# Patient Record
Sex: Female | Born: 1960
Health system: Southern US, Community
[De-identification: ages and names within clinical notes are randomized; demographics above are authoritative.]

## PROBLEM LIST (undated history)

## (undated) DIAGNOSIS — D509 Iron deficiency anemia, unspecified: Secondary | ICD-10-CM

## (undated) DIAGNOSIS — K5190 Ulcerative colitis, unspecified, without complications: Secondary | ICD-10-CM

## (undated) DIAGNOSIS — Z8639 Personal history of other endocrine, nutritional and metabolic disease: Secondary | ICD-10-CM

## (undated) DIAGNOSIS — R011 Cardiac murmur, unspecified: Secondary | ICD-10-CM

## (undated) DIAGNOSIS — M545 Low back pain, unspecified: Secondary | ICD-10-CM

## (undated) DIAGNOSIS — M1990 Unspecified osteoarthritis, unspecified site: Secondary | ICD-10-CM

## (undated) DIAGNOSIS — I10 Essential (primary) hypertension: Secondary | ICD-10-CM

## (undated) DIAGNOSIS — Z973 Presence of spectacles and contact lenses: Secondary | ICD-10-CM

## (undated) DIAGNOSIS — N921 Excessive and frequent menstruation with irregular cycle: Secondary | ICD-10-CM

## (undated) DIAGNOSIS — R000 Tachycardia, unspecified: Secondary | ICD-10-CM

## (undated) DIAGNOSIS — R0789 Other chest pain: Secondary | ICD-10-CM

## (undated) DIAGNOSIS — E785 Hyperlipidemia, unspecified: Secondary | ICD-10-CM

## (undated) HISTORY — DX: Tachycardia, unspecified: R00.0

## (undated) HISTORY — DX: Essential (primary) hypertension: I10

## (undated) HISTORY — DX: Unspecified osteoarthritis, unspecified site: M19.90

## (undated) HISTORY — PX: ECTOPIC PREGNANCY SURGERY: SHX613

## (undated) HISTORY — DX: Cardiac murmur, unspecified: R01.1

## (undated) HISTORY — DX: Other chest pain: R07.89

## (undated) HISTORY — DX: Low back pain, unspecified: M54.50

## (undated) HISTORY — PX: INTRAUTERINE DEVICE (IUD) INSERTION: SHX5877

## (undated) HISTORY — PX: OTHER SURGICAL HISTORY: SHX169

---

## 2003-03-30 ENCOUNTER — Emergency Department (HOSPITAL_COMMUNITY): Admission: EM | Admit: 2003-03-30 | Discharge: 2003-03-30 | Payer: Self-pay | Admitting: Emergency Medicine

## 2003-03-31 ENCOUNTER — Encounter (INDEPENDENT_AMBULATORY_CARE_PROVIDER_SITE_OTHER): Payer: Self-pay | Admitting: *Deleted

## 2003-03-31 ENCOUNTER — Encounter: Payer: Self-pay | Admitting: Obstetrics

## 2003-03-31 ENCOUNTER — Inpatient Hospital Stay (HOSPITAL_COMMUNITY): Admission: AD | Admit: 2003-03-31 | Discharge: 2003-04-03 | Payer: Self-pay | Admitting: *Deleted

## 2003-03-31 ENCOUNTER — Encounter: Payer: Self-pay | Admitting: Emergency Medicine

## 2003-09-24 ENCOUNTER — Encounter: Payer: Self-pay | Admitting: Obstetrics

## 2003-09-24 ENCOUNTER — Ambulatory Visit (HOSPITAL_COMMUNITY): Admission: RE | Admit: 2003-09-24 | Discharge: 2003-09-24 | Payer: Self-pay | Admitting: Obstetrics

## 2005-03-05 ENCOUNTER — Emergency Department (HOSPITAL_COMMUNITY): Admission: EM | Admit: 2005-03-05 | Discharge: 2005-03-05 | Payer: Self-pay | Admitting: Emergency Medicine

## 2005-12-04 ENCOUNTER — Ambulatory Visit (HOSPITAL_COMMUNITY): Admission: RE | Admit: 2005-12-04 | Discharge: 2005-12-04 | Payer: Self-pay | Admitting: Obstetrics

## 2006-06-12 ENCOUNTER — Inpatient Hospital Stay (HOSPITAL_COMMUNITY): Admission: AD | Admit: 2006-06-12 | Discharge: 2006-06-12 | Payer: Self-pay | Admitting: Obstetrics & Gynecology

## 2006-07-17 ENCOUNTER — Other Ambulatory Visit: Admission: RE | Admit: 2006-07-17 | Discharge: 2006-07-17 | Payer: Self-pay | Admitting: Family Medicine

## 2007-05-22 ENCOUNTER — Other Ambulatory Visit: Admission: RE | Admit: 2007-05-22 | Discharge: 2007-05-22 | Payer: Self-pay | Admitting: Gynecology

## 2010-12-24 ENCOUNTER — Encounter: Payer: Self-pay | Admitting: Internal Medicine

## 2011-04-20 NOTE — Discharge Summary (Signed)
Amy Coffey, Amy Coffey                   ACCOUNT NO.:  [id number]   MEDICAL RECORD NO.:  [id number]                   PATIENT TYPE:  INP   LOCATION:  9324                                 FACILITY:  [facility]   PHYSICIAN:  Roseanna Rainbow, M.D.         DATE OF BIRTH:  Apr 03, 1961   DATE OF ADMISSION:  03/31/2003  DATE OF DISCHARGE:  04/03/2003                                 DISCHARGE SUMMARY   CHIEF COMPLAINT:  The patient is a 51 year old gravida 4, para 1, with a  history of left lower quadrant pain for one week worsening the day prior to  presentation.   HISTORY OF PRESENT ILLNESS:  As above.  She presented to the emergency room  at [facility] and had a positive urine pregnancy test.  An ultrasound  demonstrated mass in the cul-de-sac, no fetal pole or yolk sac, and no  evidence of an intrauterine pregnancy.  Quantitative beta hCG was 13,046.  The patient has a history of three previous ectopic pregnancies and a right  salpingectomy.   MEDICATIONS:  None.   ALLERGIES:  No known drug allergies.   PAST OBSTETRICAL/GYNECOLOGICAL HISTORY:  As above.  She has had one cesarean  delivery and a laparotomy for a right partial salpingectomy.   PAST MEDICAL HISTORY:  Remarkable for Crohn's disease, dormant at this  point.   SOCIAL HISTORY:  Married.  She denies any tobacco, ethanol, or a substance  abuse.   PHYSICAL EXAMINATION:  VITAL SIGNS:  Temperature 98.2, pulse 113,  respiratory rate 20, blood pressure 166/89.  GENERAL APPEARANCE:  Well developed, well nourished, in no acute distress.  HEENT:  Normocephalic, atraumatic.  LUNGS:  Clear.  ABDOMEN:  Soft, mild left lower quadrant tenderness.  PELVIC:  The external female genitalia are normal appearing.  The vagina was  clean.  There was mild tenderness noted in the left adnexa.  No palpable  mass.  Uterus anteverted, normal size, and nontender.   OTHER LABORATORY DATA:  White count 25,000, hemoglobin 11.  Complete  metabolic profile within normal limits.   ASSESSMENT:  Left-sided ectopic pregnancy.   PLAN:  Methotrexate and admission for a 23-hour observation.   HOSPITAL COURSE:  The patient was admitted and was given a course of  methotrexate.  Approximately 12 hours into the hospital admission the  patient complained of acute worsening of her left lower quadrant pain.  A  pelvic ultrasound at this point was consistent with a hemoperitoneum and the  patient was brought to the operating room for likely ruptured ectopic  pregnancy.  Please see the dictated operative summary for details.  Her  postoperative course was remarkable for anemia with a hemoglobin of 8.4 on  postoperative day #1.  She remained afebrile and was tolerating a regular  diet and was discharged to home on postoperative day #3.   DISCHARGE DIAGNOSIS:  Ruptured left tubal ectopic pregnancy.   PROCEDURE:  Exploratory laparotomy with left salpingectomy.   CONDITION  ON DISCHARGE:  Stable.   DIET:  Regular.   ACTIVITY:  No strenuous activity.   MEDICATIONS:  1. Percocet 1-2 tabs every 4-6 hours as needed.  2. Niferex Forte one tab p.o. b.i.d.  3. The patient was also counseled to take her own ibuprofen as needed.   DISPOSITION:  The patient was to return to the office in one week for an  incision check.                                               Roseanna Rainbow, M.D.    Judee Clara  D:  04/03/2003  T:  04/03/2003  Job:  [id number]

## 2011-04-20 NOTE — Op Note (Signed)
Amy Coffey, LIBERTO                   ACCOUNT NO.:  [id number]   MEDICAL RECORD NO.:  [id number]                   PATIENT TYPE:  INP   LOCATION:  9324                                 [facility]ACILITY:  [facility]   PHYSICIAN:  Roseanna Rainbow, M.D.         DATE OF BIRTH:  Aug 26, 1961   DATE OF PROCEDURE:  03/31/2003  DATE OF DISCHARGE:                                 OPERATIVE REPORT   PREOPERATIVE DIAGNOSIS:  Ruptured ectopic tubal pregnancy.   POSTOPERATIVE DIAGNOSIS:  Ruptured ectopic tubal pregnancy.   PROCEDURE:  Exploratory laparotomy with left salpingectomy.   SURGEON:  Roseanna Rainbow, M.D.   ASSISTANT:  Bing Neighbors. Clearance Coots, M.D.   ANESTHESIA:  General endotracheal.   COMPLICATIONS:  None.   ESTIMATED BLOOD LOSS:  Less than 50 cc for the procedure; approximately 500  cc of hemoperitoneum, and blood clots approximately 500 cc seen throughout  the hemoperitoneum.   IV FLUIDS:  3 L lactated Ringer's.   URINE OUTPUT:  250 cc.   FINDINGS:  There was a fairly significant hemoperitoneum with blood clots  filling the posterior cul-de-sac.  The ampullary portion of the left  fallopian tube was dilated, and there was active bleeding noted at the  rupture site.  The ovaries and uterus appeared normal.  There was mild  inflammatory response involving the serosa of the uterus and peritoneal  surfaces of the posterior cul-de-sac.  The right fallopian tube was absent.   DESCRIPTION OF PROCEDURE:  The patient was taken to the operating room,  where general anesthesia was administered without difficulty.  She was  placed in the dorsal supine position, and prepped and draped in the usual  sterile fashion.  A Foley catheter was placed.  A Pfannenstiel skin incision  was then made through the previous scar and carried down to the underlying  fascia with the Bovie.  The fascia was incised to the midline, and this  incision was extended bilaterally with curved Mayo scissors.   The superior  aspect of the fascial incision was then grasped with Kocher clamps, tented  up and the underlying rectus muscles dissected off with the Bovie.  This was  repeated with the inferior fascial margin.  The rectus muscles were  separated in the midline.  The parietoperitoneum was entered bluntly.  The  peritoneal incision was then extended superiorly and inferiorly, with good  visualization of the bladder.  At this point an  O'Connor-Sullivan retractor was placed into the incision.  The bowel was  packed away with moistened laparotomy sponges.  A bladder blade was placed.  The blood clots were evacuated.  The above findings were noted.  The left  fallopian tube was grasped with Babcock clamp.  The mesosalpinx was clamped  with a Kelly clamp, and the tube was excised.  The pedicle was then secured  with both the free ligature and suture ligature of 2-0 Vicryl.  Adequate  hemostasis was noted.  The pelvis was copiously  irrigated with warm normal  saline.  All the laparotomy instruments were removed from the abdomen, as  well as the retractor.  The parietoperitoneum was closed in a running  fashion using 2-0 Vicryl.  The fascia was reapproximated in the running  fashion with 0 PDS.  Individual bleeding points in the subcutaneous layer  were cauterized with the Bovie.  The subcutaneous layer was irrigated. The  skin was reapproximated with staples.  At the close of the procedure, the  instrument and pack counts were said to be correct x2.  The patient was  taken to the PACU awake, and in stable condition.                                               Roseanna Rainbow, M.D.    Judee Clara  D:  03/31/2003  T:  04/01/2003  Job:  [id number]

## 2016-01-03 ENCOUNTER — Other Ambulatory Visit: Payer: Self-pay

## 2016-01-03 DIAGNOSIS — Z1231 Encounter for screening mammogram for malignant neoplasm of breast: Secondary | ICD-10-CM

## 2016-01-12 ENCOUNTER — Other Ambulatory Visit (HOSPITAL_COMMUNITY)
Admission: RE | Admit: 2016-01-12 | Discharge: 2016-01-12 | Disposition: A | Discharge status: Home / Self Care | Payer: 59 | Source: Ambulatory Visit | Attending: Family Medicine | Admitting: Family Medicine

## 2016-01-12 ENCOUNTER — Other Ambulatory Visit: Payer: Self-pay

## 2016-01-12 DIAGNOSIS — Z01419 Encounter for gynecological examination (general) (routine) without abnormal findings: Secondary | ICD-10-CM | POA: Insufficient documentation

## 2016-01-16 LAB — CYTOLOGY - PAP

## 2016-01-18 ENCOUNTER — Ambulatory Visit
Admission: RE | Admit: 2016-01-18 | Discharge: 2016-01-18 | Disposition: A | Discharge status: Home / Self Care | Payer: 59 | Source: Ambulatory Visit

## 2016-01-18 DIAGNOSIS — Z1231 Encounter for screening mammogram for malignant neoplasm of breast: Secondary | ICD-10-CM

## 2016-03-01 ENCOUNTER — Ambulatory Visit: Payer: Self-pay | Admitting: Family Medicine

## 2016-11-15 ENCOUNTER — Other Ambulatory Visit: Payer: Self-pay | Admitting: Endocrinology

## 2016-11-15 DIAGNOSIS — E221 Hyperprolactinemia: Secondary | ICD-10-CM

## 2016-11-27 ENCOUNTER — Ambulatory Visit
Admission: RE | Admit: 2016-11-27 | Discharge: 2016-11-27 | Disposition: A | Discharge status: Home / Self Care | Payer: 59 | Source: Ambulatory Visit | Attending: Endocrinology | Admitting: Endocrinology

## 2016-11-27 DIAGNOSIS — E221 Hyperprolactinemia: Secondary | ICD-10-CM

## 2016-11-27 MED ORDER — GADOBENATE DIMEGLUMINE 529 MG/ML IV SOLN
15.0000 mL | Freq: Once | INTRAVENOUS | Status: AC | PRN
  Administered 2016-11-27: 15 mL via INTRAVENOUS

## 2016-12-13 ENCOUNTER — Ambulatory Visit: Payer: 59 | Admitting: Family Medicine

## 2017-01-07 ENCOUNTER — Ambulatory Visit (INDEPENDENT_AMBULATORY_CARE_PROVIDER_SITE_OTHER): Payer: 59 | Admitting: Neurology

## 2017-01-07 ENCOUNTER — Encounter: Payer: Self-pay | Admitting: Neurology

## 2017-01-07 VITALS — BP 139/82 | HR 89 | Ht 67.5 in | Wt 243.5 lb

## 2017-01-07 DIAGNOSIS — R9089 Other abnormal findings on diagnostic imaging of central nervous system: Secondary | ICD-10-CM | POA: Diagnosis not present

## 2017-01-07 NOTE — Progress Notes (Signed)
Reason for visit: Abnormal MRI brain  Referring physician: Dr. Parke Simmers Amy Coffey is a 57 y.o. female  History of present illness:  Amy Coffey is a 56 year old right-handed black female with a history of hyperthyroidism. The patient has been noted to have a mild elevation in prolactin level, and she was therefore sent for pituitary MRI. On the MRI, they noted an empty sella, and some dilation of the optic nerve sheaths. The patient is therefore sent to this office for an evaluation. The patient has no history of headache whatsoever, she denies any neck stiffness or muffled hearing or pulsatile tinnitus. The patient denies any numbness or weakness of the face, arms, or legs. She has not had any balance issues or difficulty controlling the bowels or the bladder. She denies any dizziness, she denies any speech changes or difficulty with swallowing. She does wear glasses, she gets an annual ophthalmologic evaluation. She is planning on getting an eye examination in the near future. She is sent to this office for further evaluation.  Past Medical History:  Diagnosis Date  . High cholesterol   . Hypertension     Past Surgical History:  Procedure Laterality Date  . ECTOPIC PREGNANCY SURGERY  2006    Family History  Problem Relation Age of Onset  . Angina Mother   . Thyroid disease Mother   . Prostate cancer Father     Social history:  reports that she quit smoking about 19 years ago. She has never used smokeless tobacco. She reports that she does not drink alcohol or use drugs.  Medications:  Prior to Admission medications   Medication Sig Start Date End Date Taking? Authorizing Provider  APRISO 0.375 g 24 hr capsule Take 4 capsules by mouth daily. 11/08/16  Yes Historical Provider, MD  CAMILA 0.35 MG tablet Take 1 tablet by mouth daily. 11/14/16  Yes Historical Provider, MD  Cholecalciferol (VITAMIN D3) 1000 units CAPS Take 1 capsule by mouth daily.   Yes Historical Provider, MD   FeFum-FePoly-FA-B Cmp-C-Biot (FOLIVANE-PLUS) CAPS Take 1 capsule by mouth daily. 11/30/16  Yes Historical Provider, MD  Flaxseed, Linseed, (FLAXSEED OIL PO) Take 1 Dose by mouth daily.   Yes Historical Provider, MD  fluocinonide (LIDEX) 0.05 % external solution Apply 1 application topically as needed. 10/19/16  Yes Historical Provider, MD  losartan-hydrochlorothiazide (HYZAAR) 100-25 MG tablet Take 1 tablet by mouth daily. 12/01/16  Yes Historical Provider, MD  Omega-3 Fatty Acids (FISH OIL) 1000 MG CAPS Take 1 capsule by mouth daily.   Yes Historical Provider, MD  VOLTAREN 1 % GEL Apply 1 application topically as needed. 11/15/16  Yes Historical Provider, MD     Allergies not on file  ROS:  Out of a complete 14 system review of symptoms, the patient complains only of the following symptoms, and all other reviewed systems are negative.  Heart murmur Snoring Anemia Feeling hot, cold Joint pain  Blood pressure 139/82, pulse 89, height 5' 7.5" (1.715 m), weight 243 lb 8 oz (110.5 kg).  Physical Exam  General: The patient is alert and cooperative at the time of the examination. The patient is markedly obese.  Eyes: Pupils are equal, round, and reactive to light. Discs are flat bilaterally.  Neck: The neck is supple, no carotid bruits are noted.  Respiratory: The respiratory examination is clear.  Cardiovascular: The cardiovascular examination reveals a regular rate and rhythm, no obvious murmurs or rubs are noted.  Skin: Extremities are without significant edema.  Neurologic Exam  Mental status: The patient is alert and oriented x 3 at the time of the examination. The patient has apparent normal recent and remote memory, with an apparently normal attention span and concentration ability.  Cranial nerves: Facial symmetry is present. There is good sensation of the face to pinprick and soft touch bilaterally. The strength of the facial muscles and the muscles to head turning and  shoulder shrug are normal bilaterally. Speech is well enunciated, no aphasia or dysarthria is noted. Extraocular movements are full. Visual fields are full. The tongue is midline, and the patient has symmetric elevation of the soft palate. No obvious hearing deficits are noted.  Motor: The motor testing reveals 5 over 5 strength of all 4 extremities. Good symmetric motor tone is noted throughout.  Sensory: Sensory testing is intact to pinprick, soft touch, vibration sensation, and position sense on all 4 extremities. No evidence of extinction is noted.  Coordination: Cerebellar testing reveals good finger-nose-finger and heel-to-shin bilaterally.  Gait and station: Gait is normal. Tandem gait is normal. Romberg is negative. No drift is seen.  Reflexes: Deep tendon reflexes are symmetric and normal bilaterally. Toes are downgoing bilaterally.   MRI brain 11/27/16:  IMPRESSION: Negative for pituitary microadenoma. Enlarged sella compatible with empty sella. Small pituitary enhances homogeneously.  Small white matter hyperintensities bilaterally may be due to migraine headache or chronic microvascular ischemia.  Distended optic nerve sheaths bilaterally can be seen with elevated intracranial pressure.  * MRI scan images were reviewed online. I agree with the written report.    Assessment/Plan:  1. Abnormal MRI brain  The patient has very minimal white matter changes, white matter changes are very nonspecific. The patient has an empty sella and some dilation of the optic nerve sheaths. These changes could be associated with pseudotumor cerebri, but the patient is offering no history that would suggest this clinical entity. The patient will be going for ophthalmologic evaluation in the near future. If mild papilledema is noted, I will set the patient up for a lumbar puncture. If the eye examination is unremarkable, I would not pursue any further workup. The patient will follow-up  through this office if needed.  Amy Alexanders MD 01/07/2017 3:06 PM  [facility] [address] [place], [place] [address]  Phone [phone] Fax [phone]

## 2017-01-10 ENCOUNTER — Ambulatory Visit: Payer: 59 | Admitting: Family Medicine

## 2017-01-17 ENCOUNTER — Encounter: Payer: Self-pay | Admitting: Family Medicine

## 2017-01-17 ENCOUNTER — Ambulatory Visit (INDEPENDENT_AMBULATORY_CARE_PROVIDER_SITE_OTHER): Payer: 59 | Admitting: Family Medicine

## 2017-01-17 VITALS — BP 126/80 | HR 75 | Resp 12 | Ht 67.5 in | Wt 243.4 lb

## 2017-01-17 DIAGNOSIS — E785 Hyperlipidemia, unspecified: Secondary | ICD-10-CM

## 2017-01-17 DIAGNOSIS — N938 Other specified abnormal uterine and vaginal bleeding: Secondary | ICD-10-CM

## 2017-01-17 DIAGNOSIS — D509 Iron deficiency anemia, unspecified: Secondary | ICD-10-CM | POA: Diagnosis not present

## 2017-01-17 DIAGNOSIS — Z6837 Body mass index (BMI) 37.0-37.9, adult: Secondary | ICD-10-CM

## 2017-01-17 DIAGNOSIS — E669 Obesity, unspecified: Secondary | ICD-10-CM | POA: Insufficient documentation

## 2017-01-17 DIAGNOSIS — I10 Essential (primary) hypertension: Secondary | ICD-10-CM

## 2017-01-17 MED ORDER — LOSARTAN POTASSIUM-HCTZ 100-25 MG PO TABS: 1.0000 | ORAL_TABLET | Freq: Every day | ORAL | 2 refills | Status: DC

## 2017-01-17 NOTE — Progress Notes (Signed)
Pre visit review using our clinic review tool, if applicable. No additional management support is needed unless otherwise documented below in the visit note. 

## 2017-01-17 NOTE — Patient Instructions (Addendum)
A few things to remember from today's visit:   Iron deficiency anemia, unspecified iron deficiency anemia type - Plan: CBC, Ferritin  Hyperlipidemia, unspecified hyperlipidemia type - Plan: Lipid panel, Basic metabolic panel  BMI [id number], adult  Hypertension, essential - Plan: Basic metabolic panel, losartan-hydrochlorothiazide (HYZAAR) 100-25 MG tablet  What are some tips for weight loss? People become overweight for many reasons. Weight issues can run in families. They can be caused by unhealthy behaviors and a person's environment. Certain health problems and medicines can also lead to weight gain. There are some simple things you can do to reach and maintain a healthy weight:  Eat small more frequent healthy meals instead 3 bid meals. Also Weight Watchers is a good option. Avoid sweet drinks. These include regular soft drinks, fruit juices, fruit drinks, energy drinks, sweetened iced tea, and flavored milk. Avoid fast foods. Fast foods such as french fries, hamburgers, chicken nuggets, and pizza are high in calories and can cause weight gain. Eat a healthy breakfast. People who skip breakfast tend to weigh more. Don't watch more than two hours of television per day. Chew sugar-free gum between meals to cut down on snacking. Avoid grocery shopping when you're hungry. Pack a healthy lunch instead of eating out to control what and how much you eat. Eat a lot of fruits and vegetables. Aim for about 2 cups of fruit and 2 to 3 cups of vegetables per day. Aim for 150 minutes per week of moderate-intensity exercise (such as brisk walking), or 75 minutes per week of vigorous exercise (such as jogging or running). OR 15-30 min of daily brisk walking. Be more active. Small changes in physical activity can easily be added to your daily routine. For example, take the stairs instead of the elevator. Take a walk with your family. A daily walk is a great way to get exercise and to catch up on the  day's events.   Arrange follow up with your gyn. Please be sure medication list is accurate. If a new problem present, please set up appointment sooner than planned today.

## 2017-01-17 NOTE — Progress Notes (Signed)
HPI:   [name] is a 56 y.o. female, who is here today to establish care with me.  Former PCP: Dr Amy Coffey at [facility], [place] Last preventive routine visit: a year ago.  Chronic medical problems: HTN,HLD,hyperprolactinemia.   Hypertension: Dx at age 50. Currently she is on Losartan-HCTZ 50-12.5 mg daily and Metoprolol Succinate 50 mg daily. Reporting no side effects from medication. He is not checking BP at home.  He denies any frequent/severe headache, visual changes, chest pain, dyspnea, palpitation, abdominal pain, nausea, vomiting, or edema.   Hyperlipidemia:  Currently on nonpharmacologic treatment. In the past she was on Lipitor, which she didn't tolerate well, made her "sick." She has tried Crestor a few years ago, better tolerated, not covered by her insurance at that time.    Concerns today:    -6 months of intermittent vaginal bleeding.  She follows with gyn and according to pt, she already had work up done and underwent endometrial Bx. Prolactin level was elevated, so she was referred to endocrinologists. Brain MRI did not show pituitary microadenomas but rather an enlarged sella compatible with empty sella. She was also evaluated by ophthalmologists, who she saw yesterday.She has also seen neurologists, 01/07/17.  Elevated THS, normal when TSH was repeated.  She is taking Camilla but still having vaginal bleeding q 10 days, mild bleeding that usually lasts 5 days.Bleeding is usually noted mainly on tissue when wiping. She denies gross hematuria or urinary symptoms.  IUD was recommended but she is not interested.  She has also followed with hematology because Hx of iron deficiency anemia. She is on Iron supplementation.   -She is also c/o hot flashes, feeling hot and cold, for about 8-9 months. According to pt, she had "hormones' checked and was told she was in "menopause"  + Mood swings. She denies suicidal thoughts or Hx of  depression/anxiety.  -She does not exercise regularly and does not follow a healthy diet consistently.  She lives with her husband.   Review of Systems  Constitutional: Negative for activity change, appetite change, fatigue, fever and unexpected weight change.  HENT: Negative for mouth sores, nosebleeds and trouble swallowing.   Eyes: Negative for redness and visual disturbance.  Respiratory: Negative for cough, shortness of breath and wheezing.   Cardiovascular: Negative for chest pain, palpitations and leg swelling.  Gastrointestinal: Negative for abdominal pain, blood in stool, nausea and vomiting.       Negative for changes in bowel habits.  Endocrine: Negative for polydipsia, polyphagia and polyuria.  Genitourinary: Positive for menstrual problem and vaginal bleeding. Negative for decreased urine volume, difficulty urinating, dyspareunia, dysuria, hematuria, pelvic pain and vaginal discharge.  Musculoskeletal: Positive for arthralgias (Hx of OA.). Negative for gait problem and myalgias.  Skin: Negative for rash.  Neurological: Negative for syncope, weakness and headaches.  Hematological: Negative for adenopathy. Does not bruise/bleed easily.  Psychiatric/Behavioral: Negative for confusion and suicidal ideas. The patient is nervous/anxious.       Current Outpatient Prescriptions on File Prior to Visit  Medication Sig Dispense Refill  . APRISO 0.375 g 24 hr capsule Take 4 capsules by mouth daily.  3  . CAMILA 0.35 MG tablet Take 1 tablet by mouth daily.  4  . Cholecalciferol (VITAMIN D3) 1000 units CAPS Take 1 capsule by mouth daily.    Marland Kitchen FeFum-FePoly-FA-B Cmp-C-Biot (FOLIVANE-PLUS) CAPS Take 1 capsule by mouth daily.  1  . Flaxseed, Linseed, (FLAXSEED OIL PO) Take 1 Dose by mouth daily.    Marland Kitchen  fluocinonide (LIDEX) 0.05 % external solution Apply 1 application topically as needed.  2  . Omega-3 Fatty Acids (FISH OIL) 1000 MG CAPS Take 1 capsule by mouth daily.    . VOLTAREN 1 % GEL  Apply 1 application topically as needed.  2   No current facility-administered medications on file prior to visit.      Past Medical History:  Diagnosis Date  . Arthritis   . Blood in stool   . Heart murmur   . High cholesterol   . Hypertension    Not on File  Family History  Problem Relation Age of Onset  . Angina Mother   . Thyroid disease Mother   . Arthritis Mother   . Heart disease Mother   . Hypertension Mother   . Hyperlipidemia Mother   . Prostate cancer Father   . Arthritis Father   . Cancer Father     prostate  . Hypertension Father   . Cancer Maternal Aunt     breast    Social History   Social History  . Marital status: Married    Spouse name: Amy Coffey  . Number of children: 1  . Years of education: BA   Occupational History  . [employer]    Social History Main Topics  . Smoking status: Former Smoker    Quit date: 12/03/1997  . Smokeless tobacco: Never Used  . Alcohol use No  . Drug use: No  . Sexual activity: Not Asked   Other Topics Concern  . None   Social History Narrative   Lives with husband   Caffeine use: coffee daily   P[employer]i on weekends    Vitals:   01/17/17 1519  BP: 126/80  Pulse: 75  Resp: 12   O2 sat at RA 95%. Body mass index is 37.56 kg/m.   Physical Exam  Nursing note and vitals reviewed. Constitutional: She is oriented to person, place, and time. She appears well-developed. No distress.  HENT:  Head: Atraumatic.  Mouth/Throat: Oropharynx is clear and moist and mucous membranes are normal.  Eyes: Conjunctivae and EOM are normal. Pupils are equal, round, and reactive to light.  Neck: No tracheal deviation present. Thyromegaly (mild) present. No thyroid mass present.  Cardiovascular: Normal rate and regular rhythm.   Murmur (? Soft SEM RUSB) heard. Pulses:      Dorsalis pedis pulses are 2+ on the right side, and 2+ on the left side.  Respiratory: Effort normal and breath sounds normal. No respiratory distress.  GI:  Soft. She exhibits no mass. There is no hepatomegaly. There is no tenderness.  Musculoskeletal: She exhibits no edema or tenderness.  Lymphadenopathy:    She has no cervical adenopathy.  Neurological: She is alert and oriented to person, place, and time. She has normal strength. No cranial nerve deficit. Coordination and gait normal.  Skin: Skin is warm. No rash noted. No erythema.  Psychiatric: Her mood appears anxious.  Well groomed, good eye contact.      ASSESSMENT AND PLAN:   Grenda was seen today for establish care.  Diagnoses and all orders for this visit:  Iron deficiency anemia, unspecified iron deficiency anemia type  No changes in current management, will follow labs and will give further recommendations accordingly.  -     CBC; Future -     Ferritin; Future  Hyperlipidemia, unspecified hyperlipidemia type  Low fat diet discussed and recommended. Further recommendations will be given according to lab results.Will consider Crestor if statin treatment is  needed. F/U in 6-12 months.   -     Lipid panel; Future -     Basic metabolic panel; Future  BMI 37.0-37.9, adult  We discussed benefits of wt loss as well as adverse effects of obesity. Consistency with healthy diet and physical activity recommended. Daily brisk walking for 15-30 min as tolerated.  Hypertension, essential  Adequately controlled. No changes in current management. DASH diet recommended. Eye exam recommended annually. F/U in 6 months, before if needed.  -     Basic metabolic panel; Future -     losartan-hydrochlorothiazide (HYZAAR) 100-25 MG tablet; Take 1 tablet by mouth daily.  Dysfunctional uterine bleeding  According to pt, she has already followed with gyn,labs and Bx were done. She was offered IUD but she is not interested in doing so. Recommended following with gyn to discuss other treatment options. Reporting colonoscopy up to date. Instructed about warning signs.  -      CBC; Future   She is not fasting today, will come back in a few days for fasting labs.    Betty G. Martinique, MD  [facility]. [place] office.

## 2017-01-28 ENCOUNTER — Other Ambulatory Visit (INDEPENDENT_AMBULATORY_CARE_PROVIDER_SITE_OTHER): Payer: 59

## 2017-01-28 DIAGNOSIS — N938 Other specified abnormal uterine and vaginal bleeding: Secondary | ICD-10-CM | POA: Diagnosis not present

## 2017-01-28 DIAGNOSIS — E785 Hyperlipidemia, unspecified: Secondary | ICD-10-CM | POA: Diagnosis not present

## 2017-01-28 DIAGNOSIS — I10 Essential (primary) hypertension: Secondary | ICD-10-CM | POA: Diagnosis not present

## 2017-01-28 DIAGNOSIS — D509 Iron deficiency anemia, unspecified: Secondary | ICD-10-CM | POA: Diagnosis not present

## 2017-01-28 LAB — LIPID PANEL
Cholesterol: 227 mg/dL — ABNORMAL HIGH (ref 0–200)
HDL: 40.3 mg/dL
LDL Cholesterol: 163 mg/dL — ABNORMAL HIGH (ref 0–99)
NonHDL: 186.89
Total CHOL/HDL Ratio: 6
Triglycerides: 117 mg/dL (ref 0.0–149.0)
VLDL: 23.4 mg/dL (ref 0.0–40.0)

## 2017-01-28 LAB — BASIC METABOLIC PANEL WITH GFR
BUN: 9 mg/dL (ref 6–23)
CO2: 28 meq/L (ref 19–32)
Calcium: 9.2 mg/dL (ref 8.4–10.5)
Chloride: 104 meq/L (ref 96–112)
Creatinine, Ser: 0.8 mg/dL (ref 0.40–1.20)
GFR: 96.15 mL/min
Glucose, Bld: 92 mg/dL (ref 70–99)
Potassium: 3.7 meq/L (ref 3.5–5.1)
Sodium: 140 meq/L (ref 135–145)

## 2017-01-28 LAB — FERRITIN: Ferritin: 51.6 ng/mL (ref 10.0–291.0)

## 2017-01-28 LAB — CBC
HCT: 33.7 % — ABNORMAL LOW (ref 36.0–46.0)
Hemoglobin: 10.9 g/dL — ABNORMAL LOW (ref 12.0–15.0)
MCHC: 32.4 g/dL (ref 30.0–36.0)
MCV: 82.4 fl (ref 78.0–100.0)
PLATELETS: 547 10*3/uL — AB (ref 150.0–400.0)
RBC: 4.1 Mil/uL (ref 3.87–5.11)
RDW: 16.7 % — AB (ref 11.5–15.5)
WBC: 11.4 10*3/uL — ABNORMAL HIGH (ref 4.0–10.5)

## 2017-01-31 ENCOUNTER — Telehealth: Payer: Self-pay | Admitting: Family Medicine

## 2017-01-31 NOTE — Telephone Encounter (Signed)
Pt calling to check the status of her labs.

## 2017-02-04 ENCOUNTER — Other Ambulatory Visit: Payer: Self-pay

## 2017-02-04 MED ORDER — ROSUVASTATIN CALCIUM 10 MG PO TABS: 10.0000 mg | ORAL_TABLET | Freq: Every day | ORAL | 1 refills | Status: DC

## 2017-02-05 NOTE — Telephone Encounter (Signed)
Pt has been given lab results per Judson Roch

## 2017-03-06 ENCOUNTER — Other Ambulatory Visit: Payer: Self-pay | Admitting: Family Medicine

## 2017-03-06 DIAGNOSIS — Z1231 Encounter for screening mammogram for malignant neoplasm of breast: Secondary | ICD-10-CM

## 2017-03-13 ENCOUNTER — Ambulatory Visit
Admission: RE | Admit: 2017-03-13 | Discharge: 2017-03-13 | Disposition: A | Discharge status: Home / Self Care | Payer: 59 | Source: Ambulatory Visit | Attending: Family Medicine | Admitting: Family Medicine

## 2017-03-13 DIAGNOSIS — Z1231 Encounter for screening mammogram for malignant neoplasm of breast: Secondary | ICD-10-CM

## 2017-05-22 ENCOUNTER — Ambulatory Visit (INDEPENDENT_AMBULATORY_CARE_PROVIDER_SITE_OTHER): Payer: 59 | Admitting: Obstetrics & Gynecology

## 2017-05-22 ENCOUNTER — Encounter: Payer: Self-pay | Admitting: Obstetrics & Gynecology

## 2017-05-22 VITALS — BP 134/70

## 2017-05-22 DIAGNOSIS — E221 Hyperprolactinemia: Secondary | ICD-10-CM | POA: Diagnosis not present

## 2017-05-22 DIAGNOSIS — N920 Excessive and frequent menstruation with regular cycle: Secondary | ICD-10-CM

## 2017-05-22 NOTE — Patient Instructions (Signed)
1. Polymenorrhea BTB q2 wks on Camila.  Pelvic US/EBx benign at [place] less than a year ago.  Will obtain Medical record.  Medical management with Nexplanon or Progestin IUD discussed, patient declined.  Novasure Endometrial Ablation, risks/benefits/procedure reviewed.  Patient will think about it and call back to schedule if decides to proceed.  Hysterectomy not recommended.   2. Hyperprolactinemia ([place]) Seen by Endocrino.  MRI negative for Pituitary Microadenoma.  No medication.  Will repeat Prolactin today. - Prolactin  F/U Annual/Gyn visit when due.  Amy Coffey, it was a pleasure to see you today!  I will inform you of your result as soon as available.  Let me know if you want to proceed with the Novasure Endometrial Ablation.   Endometrial Ablation Endometrial ablation is a procedure that destroys the thin inner layer of the lining of the uterus (endometrium). This procedure may be done:  To stop heavy periods.  To stop bleeding that is causing anemia.  To control irregular bleeding.  To treat bleeding caused by small tumors (fibroids) in the endometrium.  This procedure is often an alternative to major surgery, such as removal of the uterus and cervix (hysterectomy). As a result of this procedure:  You may not be able to have children. However, if you are premenopausal (you have not gone through menopause): ? You may still have a small chance of getting pregnant. ? You will need to use a reliable method of birth control after the procedure to prevent pregnancy.  You may stop having a menstrual period, or you may have only a small amount of bleeding during your period. Menstruation may return several years after the procedure.  Tell a health care provider about:  Any allergies you have.  All medicines you are taking, including vitamins, herbs, eye drops, creams, and over-the-counter medicines.  Any problems you or family members have had with the use of anesthetic  medicines.  Any blood disorders you have.  Any surgeries you have had.  Any medical conditions you have. What are the risks? Generally, this is a safe procedure. However, problems may occur, including:  A hole (perforation) in the uterus or bowel.  Infection of the uterus, bladder, or vagina.  Bleeding.  Damage to other structures or organs.  An air bubble in the lung (air embolus).  Problems with pregnancy after the procedure.  Failure of the procedure.  Decreased ability to diagnose cancer in the endometrium.  What happens before the procedure?  You will have tests of your endometrium to make sure there are no pre-cancerous cells or cancer cells present.  You may have an ultrasound of the uterus.  You may be given medicines to thin the endometrium.  Ask your health care provider about: ? Changing or stopping your regular medicines. This is especially important if you take diabetes medicines or blood thinners. ? Taking medicines such as aspirin and ibuprofen. These medicines can thin your blood. Do not take these medicines before your procedure if your doctor tells you not to.  Plan to have someone take you home from the hospital or clinic. What happens during the procedure?  You will lie on an exam table with your feet and legs supported as in a pelvic exam.  To lower your risk of infection: ? Your health care team will wash or sanitize their hands and put on germ-free (sterile) gloves. ? Your genital area will be washed with soap.  An IV tube will be inserted into one of your veins.  You will be given a medicine to help you relax (sedative).  A surgical instrument with a light and camera (resectoscope) will be inserted into your vagina and moved into your uterus. This allows your surgeon to see inside your uterus.  Endometrial tissue will be removed using one of the following methods: ? Radiofrequency. This method uses a radiofrequency-alternating electric  current to remove the endometrium. ? Cryotherapy. This method uses extreme cold to freeze the endometrium. ? Heated-free liquid. This method uses a heated saltwater (saline) solution to remove the endometrium. ? Microwave. This method uses high-energy microwaves to heat up the endometrium and remove it. ? Thermal balloon. This method involves inserting a catheter with a balloon tip into the uterus. The balloon tip is filled with heated fluid to remove the endometrium. The procedure may vary among health care providers and hospitals. What happens after the procedure?  Your blood pressure, heart rate, breathing rate, and blood oxygen level will be monitored until the medicines you were given have worn off.  As tissue healing occurs, you may notice vaginal bleeding for 4-6 weeks after the procedure. You may also experience: ? Cramps. ? Thin, watery vaginal discharge that is light pink or brown in color. ? A need to urinate more frequently than usual. ? Nausea.  Do not drive for 24 hours if you were given a sedative.  Do not have sex or insert anything into your vagina until your health care provider approves. Summary  Endometrial ablation is done to treat the many causes of heavy menstrual bleeding.  The procedure may be done only after medications have been tried to control the bleeding.  Plan to have someone take you home from the hospital or clinic. This information is not intended to replace advice given to you by your health care provider. Make sure you discuss any questions you have with your health care provider. Document Released: 09/28/2004 Document Revised: 12/06/2016 Document Reviewed: 12/06/2016 Elsevier Interactive Patient Education  2017 [employer].

## 2017-05-22 NOTE — Progress Notes (Signed)
    Amy Coffey 1961-02-05 [id number]        56 y.o.  On Progestin-only pill  RP:  Persitent BTB/Polymenorrhea on Progestin-only pill  HPI:  Continues to have periods with mild to moderate flow x 5 days every 14-15 days on Camila.  No pelvic pain.  Investigated <1 yr ago by myself at [facility] with a Pelvic US and EBx, which was benign.  Prolactin was mildly increased.  Pituitary MRI 11/2016 didn't show any Microadenoma.  No treatment for Hyperprolactinemia, per patient, level had gone down.  Past medical history,surgical history, problem list, medications, allergies, family history and social history were all reviewed and documented in the EPIC chart.  Directed ROS with pertinent positives and negatives documented in the history of present illness/assessment and plan.  Exam:  Vitals:   05/22/17 1525  BP: 134/70   General appearance:  Normal  Gyn exam:  Vulva normal                     Speculum:  Mild menstrual bleeding.  Cervix normal, no polyp.  Vagina normal.                     Bimanual exam:  AV uterus, no adnexal mass, NT.  Assessment/Plan:  56 y.o.   1. Polymenorrhea BTB q2 wks on Camila.  Pelvic US/EBx benign at [place] less than a year ago.  Will obtain Medical record.  Medical management with Nexplanon or Progestin IUD discussed, patient declined.  Novasure Endometrial Ablation, risks/benefits/procedure reviewed.  Patient will think about it and call back to schedule if decides to proceed.  Hysterectomy not recommended.   2. Hyperprolactinemia ([place]) Seen by Endocrino.  MRI negative for Pituitary Microadenoma.  No medication.  Will repeat Prolactin today. - Prolactin  F/U Annual/Gyn visit when due.  Counseling on above issues >50% x 25 minutes.  Princess Bruins MD, 3:39 PM 05/22/2017

## 2017-05-23 ENCOUNTER — Telehealth: Payer: Self-pay

## 2017-05-23 LAB — PROLACTIN: PROLACTIN: 20.3 ng/mL

## 2017-05-23 NOTE — Telephone Encounter (Signed)
Patient asked if perhaps you could "increase" her birth control pills to help with bleeding. She said she does not think she is going to be able to afford ablation right now. (I am going to check ins benefits for her.)

## 2017-05-24 MED ORDER — NORETHIN ACE-ETH ESTRAD-FE 1-20 MG-MCG(24) PO TABS: 1.0000 | ORAL_TABLET | Freq: Every day | ORAL | 3 refills | Status: DC

## 2017-05-24 NOTE — Telephone Encounter (Signed)
Patient called back just now because she will start a new pack of pills on Sunday and if you thought increasing dose was  A good idea she wanted to get new Rx today.

## 2017-05-24 NOTE — Telephone Encounter (Signed)
Yes, given her normal Prolactin level, I agree with Lomedia 24 Fe 1/20 to be prescribed.

## 2017-05-24 NOTE — Telephone Encounter (Signed)
Rx sent to [facility]. Pt to be informed to start [place] on Sunday start, this Sunday. KW CMA

## 2017-05-27 ENCOUNTER — Telehealth: Payer: Self-pay

## 2017-05-27 NOTE — Telephone Encounter (Signed)
Per DPR access note on file I left detailed message in voice mail regarding patient's ins benefits for outpatient surgery/Novasure ablation.

## 2017-07-08 ENCOUNTER — Telehealth: Payer: Self-pay

## 2017-07-08 NOTE — Telephone Encounter (Addendum)
Patient called stating she has decided she wants to proceed with endometrial ablation.  If ok, please send surgery order.  (Patient wanted to schedule for end of next week while husband is off. I scheduled her for Thurs Aug 16 1:00pm (you are off on Friday.)

## 2017-07-09 NOTE — Telephone Encounter (Signed)
Will do, thank you

## 2017-07-11 ENCOUNTER — Encounter (HOSPITAL_BASED_OUTPATIENT_CLINIC_OR_DEPARTMENT_OTHER): Payer: Self-pay | Admitting: *Deleted

## 2017-07-11 NOTE — Progress Notes (Addendum)
NPO AFTER MN W/ EXCEPTION CLEAR LIQUIDS UNTIL 0700 (NO CREAM Tower PRODUCTS).   ARRIVE AT 1130.  NEEDS URINE PREG. AND EKG.   GETTING CBC AND BMET DONE AT DR LAVOIE OFFICE PRIOR TO DOS.

## 2017-07-12 ENCOUNTER — Other Ambulatory Visit: Payer: Self-pay | Admitting: Obstetrics & Gynecology

## 2017-07-12 DIAGNOSIS — N920 Excessive and frequent menstruation with regular cycle: Secondary | ICD-10-CM

## 2017-07-14 NOTE — Progress Notes (Signed)
HPI:   [name] is a 56 y.o. female, who is here today to follow on some chronic medical problems.  Last seen on 01/17/17. Since her last OV, she has seen Dr Dellis Filbert, gyn.  Hypertension:   Dx around 14 years ago. Currently on Losartan-HCTZ 50-25 mg and Metoprolol Succinate 50 mg daily.   She is taking medications as instructed, no side effects reported.  She denies headache, visual changes, exertional chest pain, dyspnea,  focal weakness, or edema.   Lab Results  Component Value Date   CREATININE 0.80 01/28/2017   BUN 9 01/28/2017   NA 140 01/28/2017   K 3.7 01/28/2017   CL 104 01/28/2017   CO2 28 01/28/2017      Hyperlipidemia:  Currently on Crestor 10 mg daily , which was started in 01/2017. Following a low fat diet:Trying to do so.  She has not noted side effects with medication.  Lab Results  Component Value Date   CHOL 227 (H) 01/28/2017   HDL 40.30 01/28/2017   LDLCALC 163 (H) 01/28/2017   TRIG 117.0 01/28/2017   CHOLHDL 6 01/28/2017   She is not exercising regularly.  She also would like her iron checked. Hx of iron deficiency anemia and DUB, she is on Fe Sulfate 325 mg daily and recently her OCP was changed by her gyn. About 2 weeks ago she felt more fatigue than usual, denies pica. Denies exertional dyspnea ,palpitations, dizziness, or syncope.  Lab Results  Component Value Date   WBC 11.4 (H) 01/28/2017   HGB 10.9 (L) 01/28/2017   HCT 33.7 (L) 01/28/2017   MCV 82.4 01/28/2017   PLT 547.0 (H) 01/28/2017   LMP a month ago.  Denies gun/nose bleeding, gross hematuria,melena,blood in stool, or bruising.  She also would like her ear check. Right ear mild hearing loss. She states that she has had trouble with cerumen impaction in the past. She denies recent URI, ear discharge, or earache. She has not used OTC cerumen removal products.    Review of Systems  Constitutional: Positive for fatigue. Negative for activity change,  appetite change, fever and unexpected weight change.  HENT: Positive for hearing loss. Negative for ear discharge, ear pain, mouth sores, nosebleeds, sore throat, trouble swallowing and voice change.   Eyes: Negative for redness and visual disturbance.  Respiratory: Negative for cough, shortness of breath and wheezing.   Cardiovascular: Negative for chest pain, palpitations and leg swelling.  Gastrointestinal: Negative for abdominal pain, nausea and vomiting.       Negative for changes in bowel habits.  Endocrine: Negative for cold intolerance and heat intolerance.  Genitourinary: Negative for decreased urine volume, dysuria and hematuria.  Musculoskeletal: Negative for gait problem and myalgias.  Skin: Negative for pallor and rash.  Allergic/Immunologic: Positive for environmental allergies.  Neurological: Negative for dizziness, syncope, weakness and headaches.  Hematological: Negative for adenopathy. Does not bruise/bleed easily.  Psychiatric/Behavioral: Negative for confusion. The patient is not nervous/anxious.      Current Outpatient Prescriptions on File Prior to Visit  Medication Sig Dispense Refill  . Cholecalciferol (VITAMIN D3) 1000 units CAPS Take 1 capsule by mouth daily.    . ferrous sulfate 325 (65 FE) MG EC tablet Take 325 mg by mouth every morning.    . mesalamine (APRISO) 0.375 g 24 hr capsule Take 1,500 mg by mouth every morning.     . Norethindrone Acetate-Ethinyl Estrad-FE (LOMEDIA 24 FE) 1-20 MG-MCG(24) tablet Take 1 tablet by mouth  daily. (Patient taking differently: Take 1 tablet by mouth every evening. ) 1 Package 3  . VOLTAREN 1 % GEL Apply 1 application topically as needed.   2   No current facility-administered medications on file prior to visit.      Past Medical History:  Diagnosis Date  . Arthritis    knees  . Heart murmur    per pt had echo done yrs ago was told mild  . History of hyperprolactinemia    02/ 2018  resolved  . Hyperlipidemia   .  Hypertension   . Iron deficiency anemia   . Menometrorrhagia    REFRACTORY TO PROGESTIN  . Ulcerative colitis ([place]) followed by [facility]   per pt dx 1990s  . Wears glasses    No Known Allergies  Social History   Social History  . Marital status: Married    Spouse name: [name]  . Number of children: 1  . Years of education: BA   Occupational History  . [employer]    Social History Main Topics  . Smoking status: Former Smoker    Years: 10.00    Types: Cigarettes    Quit date: 12/03/1997  . Smokeless tobacco: Never Used  . Alcohol use No  . Drug use: No  . Sexual activity: Not Asked   Other Topics Concern  . None   Social History Narrative   Lives with husband   Caffeine use: coffee daily   Pepsi on weekends    Vitals:   07/15/17 1434  BP: 110/80  Pulse: 82  Resp: 12  SpO2: 97%   Body mass index is 38.6 kg/m.   Wt Readings from Last 3 Encounters:  07/15/17 250 lb 2 oz (113.5 kg)  01/17/17 243 lb 6 oz (110.4 kg)  01/07/17 243 lb 8 oz (110.5 kg)     Physical Exam  Nursing note and vitals reviewed. Constitutional: She is oriented to person, place, and time. She appears well-developed. No distress.  HENT:  Head: Normocephalic and atraumatic.  Right Ear: External ear normal.  Left Ear: Hearing, tympanic membrane, external ear and ear canal normal.  Mouth/Throat: Oropharynx is clear and moist and mucous membranes are normal.  Right ear cerumen impaction, cannot visualized TM  Eyes: Pupils are equal, round, and reactive to light. Conjunctivae are normal.  Cardiovascular: Normal rate and regular rhythm.   Murmur (soft SEM RUSB) heard. Pulses:      Dorsalis pedis pulses are 2+ on the right side, and 2+ on the left side.  Respiratory: Effort normal and breath sounds normal. No respiratory distress.  GI: Soft. She exhibits no mass. There is no hepatomegaly. There is no tenderness.  Musculoskeletal: She exhibits no edema or tenderness.  Lymphadenopathy:    She  has no cervical adenopathy.  Neurological: She is alert and oriented to person, place, and time. She has normal strength. Gait normal.  Skin: Skin is warm. No rash noted. No erythema.  Psychiatric: She has a normal mood and affect.  Well groomed, good eye contact.     ASSESSMENT AND PLAN:   [name] was seen today for follow-up.  Diagnoses and all orders for this visit:  Hypertension, essential  Adequately controlled. No changes in current management. DASH-low salt diet recommended. Eye exam recommended annually. F/U in 6 months, before if needed.  -     Comprehensive metabolic panel; Future -     losartan-hydrochlorothiazide (HYZAAR) 100-25 MG tablet; Take 1 tablet by mouth every morning.  Class  2 obesity without serious comorbidity with body mass index (BMI) of 38.0 to 38.9 in adult, unspecified obesity type  Gained about 7 Lb since 01/2017. We discussed benefits of wt loss as well as adverse effects of obesity. Consistency with healthy diet and physical activity recommended. Daily brisk walking for 15-30 min as tolerated.  Hyperlipidemia, unspecified hyperlipidemia type  She is not fasting today. She will be here on 08/02/17 for fasting labs. Low fat diet and Crestor to continue. Further recommendations will be given according to results.  -     Lipid panel; Future -     Comprehensive metabolic panel; Future -     rosuvastatin (CRESTOR) 10 MG tablet; Take 1 tablet (10 mg total) by mouth every evening.  Iron deficiency anemia, unspecified iron deficiency anemia type  No changes in current management. We will follow labs and will give further recommendations accordingly. Colonoscopy 11/16/16.  -     Ferritin; Future -     CBC; Future  Conductive hearing loss of right ear, unspecified hearing status on contralateral side  After discussion of risks vs benefits of ear lavage, she wishes to have procedure done. Tolerated well.  Impacted cerumen of right  ear  Successful ear lavage. Avoid Q tips. F/U as needed.    -[name] was advised to return sooner than planned today if new concerns arise.       Ayoub Arey G. Martinique, MD  [facility]. [place] office.

## 2017-07-15 ENCOUNTER — Ambulatory Visit (INDEPENDENT_AMBULATORY_CARE_PROVIDER_SITE_OTHER): Payer: 59 | Admitting: Family Medicine

## 2017-07-15 ENCOUNTER — Encounter: Payer: Self-pay | Admitting: Family Medicine

## 2017-07-15 VITALS — BP 110/80 | HR 82 | Resp 12 | Ht 67.5 in | Wt 250.1 lb

## 2017-07-15 DIAGNOSIS — H6121 Impacted cerumen, right ear: Secondary | ICD-10-CM | POA: Diagnosis not present

## 2017-07-15 DIAGNOSIS — Z6838 Body mass index (BMI) 38.0-38.9, adult: Secondary | ICD-10-CM

## 2017-07-15 DIAGNOSIS — D509 Iron deficiency anemia, unspecified: Secondary | ICD-10-CM

## 2017-07-15 DIAGNOSIS — E785 Hyperlipidemia, unspecified: Secondary | ICD-10-CM | POA: Diagnosis not present

## 2017-07-15 DIAGNOSIS — I10 Essential (primary) hypertension: Secondary | ICD-10-CM

## 2017-07-15 DIAGNOSIS — E669 Obesity, unspecified: Secondary | ICD-10-CM

## 2017-07-15 DIAGNOSIS — H9011 Conductive hearing loss, unilateral, right ear, with unrestricted hearing on the contralateral side: Secondary | ICD-10-CM

## 2017-07-15 MED ORDER — ROSUVASTATIN CALCIUM 10 MG PO TABS: 10.0000 mg | ORAL_TABLET | Freq: Every evening | ORAL | 2 refills | Status: DC

## 2017-07-15 MED ORDER — LOSARTAN POTASSIUM-HCTZ 100-25 MG PO TABS: 1.0000 | ORAL_TABLET | ORAL | 2 refills | Status: DC

## 2017-07-15 NOTE — Patient Instructions (Addendum)
A few things to remember from today's visit:   Hypertension, essential - Plan: Comprehensive metabolic panel  BMI 93.7-16.9, adult  Hyperlipidemia, unspecified hyperlipidemia type - Plan: Lipid panel, Comprehensive metabolic panel  Iron deficiency anemia, unspecified iron deficiency anemia type - Plan: Ferritin, CBC  Labs on 08/02/17.   DASH Eating Plan DASH stands for "Dietary Approaches to Stop Hypertension." The DASH eating plan is a healthy eating plan that has been shown to reduce high blood pressure (hypertension). It may also reduce your risk for type 2 diabetes, heart disease, and stroke. The DASH eating plan may also help with weight loss. What are tips for following this plan? General guidelines  Avoid eating more than 2,300 mg (milligrams) of salt (sodium) a day. If you have hypertension, you may need to reduce your sodium intake to 1,500 mg a day.  Limit alcohol intake to no more than 1 drink a day for nonpregnant women and 2 drinks a day for men. One drink equals 12 oz of beer, 5 oz of wine, or 1 oz of hard liquor.  Work with your health care provider to maintain a healthy body weight or to lose weight. Ask what an ideal weight is for you.  Get at least 30 minutes of exercise that causes your heart to beat faster (aerobic exercise) most days of the week. Activities may include walking, swimming, or biking.  Work with your health care provider or diet and nutrition specialist (dietitian) to adjust your eating plan to your individual calorie needs. Reading food labels  Check food labels for the amount of sodium per serving. Choose foods with less than 5 percent of the Daily Value of sodium. Generally, foods with less than 300 mg of sodium per serving fit into this eating plan.  To find whole grains, look for the word "whole" as the first word in the ingredient list. Shopping  Buy products labeled as "low-sodium" or "no salt added."  Buy fresh foods. Avoid canned foods  and premade or frozen meals. Cooking  Avoid adding salt when cooking. Use salt-free seasonings or herbs instead of table salt or sea salt. Check with your health care provider or pharmacist before using salt substitutes.  Do not fry foods. Cook foods using healthy methods such as baking, boiling, grilling, and broiling instead.  Cook with heart-healthy oils, such as olive, canola, soybean, or sunflower oil. Meal planning   Eat a balanced diet that includes: ? 5 or more servings of fruits and vegetables each day. At each meal, try to fill half of your plate with fruits and vegetables. ? Up to 6-8 servings of whole grains each day. ? Less than 6 oz of lean meat, poultry, or fish each day. A 3-oz serving of meat is about the same size as a deck of cards. One egg equals 1 oz. ? 2 servings of low-fat dairy each day. ? A serving of nuts, seeds, or beans 5 times each week. ? Heart-healthy fats. Healthy fats called Omega-3 fatty acids are found in foods such as flaxseeds and coldwater fish, like sardines, salmon, and mackerel.  Limit how much you eat of the following: ? Canned or prepackaged foods. ? Food that is high in trans fat, such as fried foods. ? Food that is high in saturated fat, such as fatty meat. ? Sweets, desserts, sugary drinks, and other foods with added sugar. ? Full-fat dairy products.  Do not salt foods before eating.  Try to eat at least 2 vegetarian meals each  week.  Eat more home-cooked food and less restaurant, buffet, and fast food.  When eating at a restaurant, ask that your food be prepared with less salt or no salt, if possible. What foods are recommended? The items listed may not be a complete list. Talk with your dietitian about what dietary choices are best for you. Grains Whole-grain or whole-wheat bread. Whole-grain or whole-wheat pasta. Brown rice. [place]. Bulgur. Whole-grain and low-sodium cereals. Pita bread. Low-fat, low-sodium crackers.  Whole-wheat flour tortillas. Vegetables Fresh or frozen vegetables (raw, steamed, roasted, or grilled). Low-sodium or reduced-sodium tomato and vegetable juice. Low-sodium or reduced-sodium tomato sauce and tomato paste. Low-sodium or reduced-sodium canned vegetables. Fruits All fresh, dried, or frozen fruit. Canned fruit in natural juice (without added sugar). Meat and other protein foods Skinless chicken or [place]. Ground chicken or [place]. Pork with fat trimmed off. Fish and seafood. Egg whites. Dried beans, peas, or lentils. Unsalted nuts, nut butters, and seeds. Unsalted canned beans. Lean cuts of beef with fat trimmed off. Low-sodium, lean deli meat. Dairy Low-fat (1%) or fat-free (skim) milk. Fat-free, low-fat, or reduced-fat cheeses. Nonfat, low-sodium ricotta or cottage cheese. Low-fat or nonfat yogurt. Low-fat, low-sodium cheese. Fats and oils Soft margarine without trans fats. Vegetable oil. Low-fat, reduced-fat, or light mayonnaise and salad dressings (reduced-sodium). Canola, safflower, olive, soybean, and sunflower oils. Avocado. Seasoning and other foods Herbs. Spices. Seasoning mixes without salt. Unsalted popcorn and pretzels. Fat-free sweets. What foods are not recommended? The items listed may not be a complete list. Talk with your dietitian about what dietary choices are best for you. Grains Baked goods made with fat, such as croissants, muffins, or some breads. Dry pasta or rice meal packs. Vegetables Creamed or fried vegetables. Vegetables in a cheese sauce. Regular canned vegetables (not low-sodium or reduced-sodium). Regular canned tomato sauce and paste (not low-sodium or reduced-sodium). Regular tomato and vegetable juice (not low-sodium or reduced-sodium). Angie Fava. Olives. Fruits Canned fruit in a light or heavy syrup. Fried fruit. Fruit in cream or butter sauce. Meat and other protein foods Fatty cuts of meat. Ribs. Fried meat. Berniece Salines. Sausage. Bologna and other  processed lunch meats. Salami. Fatback. Hotdogs. Bratwurst. Salted nuts and seeds. Canned beans with added salt. Canned or smoked fish. Whole eggs or egg yolks. Chicken or [place] with skin. Dairy Whole or 2% milk, cream, and half-and-half. Whole or full-fat cream cheese. Whole-fat or sweetened yogurt. Full-fat cheese. Nondairy creamers. Whipped toppings. Processed cheese and cheese spreads. Fats and oils Butter. Stick margarine. Lard. Shortening. Ghee. Bacon fat. Tropical oils, such as coconut, palm kernel, or palm oil. Seasoning and other foods Salted popcorn and pretzels. Onion salt, garlic salt, seasoned salt, table salt, and sea salt. Worcestershire sauce. Tartar sauce. Barbecue sauce. Teriyaki sauce. Soy sauce, including reduced-sodium. Steak sauce. Canned and packaged gravies. Fish sauce. Oyster sauce. Cocktail sauce. Horseradish that you find on the shelf. Ketchup. Mustard. Meat flavorings and tenderizers. Bouillon cubes. Hot sauce and Tabasco sauce. Premade or packaged marinades. Premade or packaged taco seasonings. Relishes. Regular salad dressings. Where to find more information:  National Heart, Lung, and [place]: https://wilson-eaton.com/  American Heart Association: www.heart.org Summary  The DASH eating plan is a healthy eating plan that has been shown to reduce high blood pressure (hypertension). It may also reduce your risk for type 2 diabetes, heart disease, and stroke.  With the DASH eating plan, you should limit salt (sodium) intake to 2,300 mg a day. If you have hypertension, you may need to reduce your  sodium intake to 1,500 mg a day.  When on the DASH eating plan, aim to eat more fresh fruits and vegetables, whole grains, lean proteins, low-fat dairy, and heart-healthy fats.  Work with your health care provider or diet and nutrition specialist (dietitian) to adjust your eating plan to your individual calorie needs. This information is not intended to replace advice given to  you by your health care provider. Make sure you discuss any questions you have with your health care provider. Document Released: 11/08/2011 Document Revised: 11/12/2016 Document Reviewed: 11/12/2016 Elsevier Interactive Patient Education  2017 [employer].  Please be sure medication list is accurate. If a new problem present, please set up appointment sooner than planned today.

## 2017-07-17 ENCOUNTER — Encounter: Payer: Self-pay | Admitting: Anesthesiology

## 2017-07-18 ENCOUNTER — Ambulatory Visit: Payer: 59 | Admitting: Family Medicine

## 2017-07-29 ENCOUNTER — Emergency Department (HOSPITAL_COMMUNITY): Payer: 59

## 2017-07-29 ENCOUNTER — Emergency Department (HOSPITAL_COMMUNITY)
Admission: EM | Admit: 2017-07-29 | Discharge: 2017-07-30 | Disposition: A | Discharge status: Home / Self Care | Payer: 59 | Attending: Emergency Medicine | Admitting: Emergency Medicine

## 2017-07-29 ENCOUNTER — Encounter (HOSPITAL_COMMUNITY): Payer: Self-pay | Admitting: Emergency Medicine

## 2017-07-29 DIAGNOSIS — Z5321 Procedure and treatment not carried out due to patient leaving prior to being seen by health care provider: Secondary | ICD-10-CM | POA: Diagnosis not present

## 2017-07-29 DIAGNOSIS — M549 Dorsalgia, unspecified: Secondary | ICD-10-CM | POA: Diagnosis not present

## 2017-07-29 DIAGNOSIS — R079 Chest pain, unspecified: Secondary | ICD-10-CM | POA: Diagnosis present

## 2017-07-29 DIAGNOSIS — R110 Nausea: Secondary | ICD-10-CM | POA: Insufficient documentation

## 2017-07-29 LAB — BASIC METABOLIC PANEL
Anion gap: 12 (ref 5–15)
BUN: 11 mg/dL (ref 6–20)
CALCIUM: 9.6 mg/dL (ref 8.9–10.3)
CO2: 24 mmol/L (ref 22–32)
CREATININE: 0.96 mg/dL (ref 0.44–1.00)
Chloride: 100 mmol/L — ABNORMAL LOW (ref 101–111)
GFR calc Af Amer: >60 mL/min (ref >60)
Glucose, Bld: 129 mg/dL — ABNORMAL HIGH (ref 65–99)
Potassium: 3.1 mmol/L — ABNORMAL LOW (ref 3.5–5.1)
SODIUM: 136 mmol/L (ref 135–145)

## 2017-07-29 LAB — CBC
HCT: 36 % (ref 36.0–46.0)
Hemoglobin: 11.3 g/dL — ABNORMAL LOW (ref 12.0–15.0)
MCH: 26.3 pg (ref 26.0–34.0)
MCHC: 31.4 g/dL (ref 30.0–36.0)
MCV: 83.7 fL (ref 78.0–100.0)
PLATELETS: 575 10*3/uL — AB (ref 150–400)
RBC: 4.3 MIL/uL (ref 3.87–5.11)
RDW: 14.6 % (ref 11.5–15.5)
WBC: 19.2 10*3/uL — AB (ref 4.0–10.5)

## 2017-07-29 LAB — I-STAT TROPONIN, ED: Troponin i, poc: 0 ng/mL (ref 0.00–0.08)

## 2017-07-29 MED ORDER — ONDANSETRON 4 MG PO TBDP
ORAL_TABLET | ORAL | Status: AC
  Administered 2017-07-29: 4 mg
  Filled 2017-07-29: qty 1

## 2017-07-29 MED ORDER — ONDANSETRON 4 MG PO TBDP: 4.0000 mg | ORAL_TABLET | Freq: Once | ORAL | Status: DC | PRN

## 2017-07-29 NOTE — ED Triage Notes (Signed)
Pt presents with L side chest "cramping" that began 1 hr PTA while the pt was lying; pt states she began to feel nauseated and then experienced back pain; pt denies vomiting, diaphoresis or syncope

## 2017-07-29 NOTE — ED Notes (Signed)
Dr. Johnney Killian notified on pt.'s hypertension /CP at triage .

## 2017-08-01 ENCOUNTER — Telehealth: Payer: Self-pay | Admitting: Family Medicine

## 2017-08-01 DIAGNOSIS — R7989 Other specified abnormal findings of blood chemistry: Secondary | ICD-10-CM

## 2017-08-01 DIAGNOSIS — E229 Hyperfunction of pituitary gland, unspecified: Principal | ICD-10-CM

## 2017-08-01 NOTE — Telephone Encounter (Signed)
Pt would like to have (Prolactin) labs added to her lab draw 08/02/17 at 8:15

## 2017-08-02 ENCOUNTER — Other Ambulatory Visit (INDEPENDENT_AMBULATORY_CARE_PROVIDER_SITE_OTHER): Payer: 59

## 2017-08-02 DIAGNOSIS — I10 Essential (primary) hypertension: Secondary | ICD-10-CM | POA: Diagnosis not present

## 2017-08-02 DIAGNOSIS — D509 Iron deficiency anemia, unspecified: Secondary | ICD-10-CM | POA: Diagnosis not present

## 2017-08-02 DIAGNOSIS — E785 Hyperlipidemia, unspecified: Secondary | ICD-10-CM

## 2017-08-02 LAB — COMPREHENSIVE METABOLIC PANEL
ALT: 25 U/L (ref 0–35)
AST: 17 U/L (ref 0–37)
Albumin: 3.8 g/dL (ref 3.5–5.2)
Alkaline Phosphatase: 83 U/L (ref 39–117)
BILIRUBIN TOTAL: 0.5 mg/dL (ref 0.2–1.2)
BUN: 9 mg/dL (ref 6–23)
CHLORIDE: 101 meq/L (ref 96–112)
CO2: 29 mEq/L (ref 19–32)
CREATININE: 0.84 mg/dL (ref 0.40–1.20)
Calcium: 9.3 mg/dL (ref 8.4–10.5)
GFR: 90.71 mL/min (ref >60.00)
GLUCOSE: 97 mg/dL (ref 70–99)
Potassium: 3.9 mEq/L (ref 3.5–5.1)
Sodium: 137 mEq/L (ref 135–145)
Total Protein: 6.7 g/dL (ref 6.0–8.3)

## 2017-08-02 LAB — CBC
HEMATOCRIT: 34.5 % — AB (ref 36.0–46.0)
Hemoglobin: 10.9 g/dL — ABNORMAL LOW (ref 12.0–15.0)
MCHC: 31.8 g/dL (ref 30.0–36.0)
MCV: 84.9 fl (ref 78.0–100.0)
PLATELETS: 545 10*3/uL — AB (ref 150.0–400.0)
RBC: 4.06 Mil/uL (ref 3.87–5.11)
RDW: 15.5 % (ref 11.5–15.5)
WBC: 13 10*3/uL — ABNORMAL HIGH (ref 4.0–10.5)

## 2017-08-02 LAB — LIPID PANEL
Cholesterol: 132 mg/dL (ref 0–200)
HDL: 41.4 mg/dL (ref >39.00)
LDL Cholesterol: 70 mg/dL (ref 0–99)
NonHDL: 90.67
Total CHOL/HDL Ratio: 3
Triglycerides: 102 mg/dL (ref 0.0–149.0)
VLDL: 20.4 mg/dL (ref 0.0–40.0)

## 2017-08-02 LAB — FERRITIN: FERRITIN: 89.2 ng/mL (ref 10.0–291.0)

## 2017-08-02 NOTE — Telephone Encounter (Signed)
She is following with gyn for hyperprolactinemia. So it needs to be done through her gyn's office if appropriate. Please instruct Amy Coffey to continue following with her gyn for this problem. Thanks, BJ

## 2017-08-02 NOTE — Telephone Encounter (Signed)
Is it okay to add the Prolactin to her lab work? It was elevated the previous time she had it checked and she just wanted to make sure it was back to normal.

## 2017-08-06 ENCOUNTER — Telehealth: Payer: Self-pay

## 2017-08-06 ENCOUNTER — Encounter: Payer: Self-pay | Admitting: Family Medicine

## 2017-08-06 NOTE — Telephone Encounter (Signed)
Her gyn will have to order the test since they are the ones following on it.

## 2017-08-06 NOTE — Telephone Encounter (Signed)
callled pt and she is aware of the above msg and will let the OB-GYN know.  Pt would like to have lab results she is aware that the results are not available.

## 2017-08-06 NOTE — Telephone Encounter (Signed)
Patient called in voice mail stating she wants to cancel her Novasure ablation scheduled for 08/16/17 as she is uncomfortable with the idea of general anesthesia.  She wants to proceed with IUD as discussed with Dr. Dellis Filbert. I called her back but got her voice mail. Told her I will cancel surgery and forward request for IUD to Abigail Butts to check on ins.

## 2017-08-06 NOTE — Telephone Encounter (Signed)
Mirena or Verdia Kuba would be fine.

## 2017-08-06 NOTE — Telephone Encounter (Signed)
Dr. Kristine Linea mentioned "progestin only IUD" in your office note. Patient has decided she wants IUD.  Which one did you want to order?

## 2017-08-07 NOTE — Telephone Encounter (Signed)
Note sent to [facility] requesting check insurance benefits.

## 2017-08-12 ENCOUNTER — Other Ambulatory Visit: Payer: Self-pay

## 2017-08-12 ENCOUNTER — Telehealth: Payer: Self-pay | Admitting: Family Medicine

## 2017-08-12 ENCOUNTER — Other Ambulatory Visit: Payer: Self-pay | Admitting: Family Medicine

## 2017-08-12 NOTE — Telephone Encounter (Signed)
I called and spoke with patient. We went over her lab results. I advised her to continue the Crestor for now, since you advised no changes in her current medications. Patient did mention that her blood pressure has been high, but she thinks it is from her birth control pills. She is coming off of the pills, so I advised her to keep monitoring her blood pressure after she comes off of the birth control pills, and to let [place] know if it is still elevated. Patient verbalized understanding.

## 2017-08-12 NOTE — Telephone Encounter (Signed)
Noted. Agree with recommendations.  Breland Trouten [place], MD

## 2017-08-13 ENCOUNTER — Encounter: Payer: Self-pay | Admitting: Obstetrics & Gynecology

## 2017-08-13 ENCOUNTER — Ambulatory Visit (INDEPENDENT_AMBULATORY_CARE_PROVIDER_SITE_OTHER): Payer: BLUE CROSS/BLUE SHIELD | Admitting: Obstetrics & Gynecology

## 2017-08-13 VITALS — BP 136/88

## 2017-08-13 DIAGNOSIS — Z30430 Encounter for insertion of intrauterine contraceptive device: Secondary | ICD-10-CM

## 2017-08-13 DIAGNOSIS — N921 Excessive and frequent menstruation with irregular cycle: Secondary | ICD-10-CM

## 2017-08-13 NOTE — Progress Notes (Signed)
    Amy Coffey 1961/07/27 [id number]        56 y.o.  G0  RP:  Insertion of Mirena IUD for Menometrorrhagia  HPI:  Currently on her period, normal flow.  No pelvic pain.  Past medical history,surgical history, problem list, medications, allergies, family history and social history were all reviewed and documented in the EPIC chart.  Directed ROS with pertinent positives and negatives documented in the history of present illness/assessment and plan.  Exam:  Vitals:   08/13/17 1403  BP: 136/88   General appearance:  Normal                                                                    IUD procedure note       Patient presented to the office today for placement of Mirena IUD. The patient had previously been provided with literature information on this method of contraception. The risks benefits and pros and cons were discussed and all her questions were answered. She is fully aware that this form of contraception is 99% effective and is good for 5 years.  Pelvic exam: Vulva normal Bartholin urethra Skene glands: Within normal limits Vagina: No lesions or discharge Cervix: No lesions or discharge Uterus: AV position, normal volume Adnexa: No masses or tenderness Rectal exam: Not done  The cervix was cleansed with Betadine solution.  Hurricane spray of the cervix.  A single-tooth tenaculum was placed on the anterior cervical lip. The IUD was shown to the patient and inserted in a sterile fashion. The uterus sounded to 8 centimeter with the IUD.  The IUD string was trimmed. The single-tooth tenaculum was removed. Patient was instructed to return back to the office in one month for follow up.       Assessment/Plan:  56 y.o.   1. Encounter for IUD insertion Easy insertion of Mirena IUD as described above.  Patient informed that BTB is frequent at first and will likely improve in the coming 3 months.  F/U in 4 wks for IUD check.  2. Menometrorrhagia Benign EBx and Normal IU  cavity per Pelvic US < 1 year ago at [facility].  Princess Bruins MD, 2:18 PM 08/13/2017

## 2017-08-13 NOTE — Patient Instructions (Signed)
1. Encounter for IUD insertion Easy insertion of Mirena IUD as described above.  Patient informed that BTB is frequent at first and will likely improve in the coming 3 months.  F/U in 4 wks for IUD check.  2. Menometrorrhagia Benign EBx and Normal IU cavity per Pelvic US < 1 year ago at [facility].  Amy Coffey, good to see you today!   Intrauterine Device Insertion, Care After This sheet gives you information about how to care for yourself after your procedure. Your health care provider may also give you more specific instructions. If you have problems or questions, contact your health care provider. What can I expect after the procedure? After the procedure, it is common to have:  Cramps and pain in the abdomen.  Light bleeding (spotting) or heavier bleeding that is like your menstrual period. This may last for up to a few days.  Lower back pain.  Dizziness.  Headaches.  Nausea.  Follow these instructions at home:  Before resuming sexual activity, check to make sure that you can feel the IUD string(s). You should be able to feel the end of the string(s) below the opening of your cervix. If your IUD string is in place, you may resume sexual activity. ? If you had a hormonal IUD inserted more than 7 days after your most recent period started, you will need to use a backup method of birth control for 7 days after IUD insertion. Ask your health care provider whether this applies to you.  Continue to check that the IUD is still in place by feeling for the string(s) after every menstrual period, or once a month.  Take over-the-counter and prescription medicines only as told by your health care provider.  Do not drive or use heavy machinery while taking prescription pain medicine.  Keep all follow-up visits as told by your health care provider. This is important. Contact a health care provider if:  You have bleeding that is heavier or lasts longer than a normal menstrual  cycle.  You have a fever.  You have cramps or abdominal pain that get worse or do not get better with medicine.  You develop abdominal pain that is new or is not in the same area of earlier cramping and pain.  You feel lightheaded or weak.  You have abnormal or bad-smelling discharge from your vagina.  You have pain during sexual activity.  You have any of the following problems with your IUD string(s): ? The string bothers or hurts you or your sexual partner. ? You cannot feel the string. ? The string has gotten longer.  You can feel the IUD in your vagina.  You think you may be pregnant, or you miss your menstrual period.  You think you may have an STI (sexually transmitted infection). Get help right away if:  You have flu-like symptoms.  You have a fever and chills.  You can feel that your IUD has slipped out of place. Summary  After the procedure, it is common to have cramps and pain in the abdomen. It is also common to have light bleeding (spotting) or heavier bleeding that is like your menstrual period.  Continue to check that the IUD is still in place by feeling for the string(s) after every menstrual period, or once a month.  Keep all follow-up visits as told by your health care provider. This is important.  Contact your health care provider if you have problems with your IUD string(s), such as the string getting  longer or bothering you or your sexual partner. This information is not intended to replace advice given to you by your health care provider. Make sure you discuss any questions you have with your health care provider. Document Released: 07/18/2011 Document Revised: 10/10/2016 Document Reviewed: 10/10/2016 Elsevier Interactive Patient Education  2017 [employer].

## 2017-08-15 ENCOUNTER — Ambulatory Visit: Payer: 59 | Admitting: Obstetrics & Gynecology

## 2017-08-16 ENCOUNTER — Ambulatory Visit: Admit: 2017-08-16 | Payer: 59 | Admitting: Obstetrics & Gynecology

## 2017-08-16 HISTORY — DX: Iron deficiency anemia, unspecified: D50.9

## 2017-08-16 HISTORY — DX: Presence of spectacles and contact lenses: Z97.3

## 2017-08-16 HISTORY — DX: Personal history of other endocrine, nutritional and metabolic disease: Z86.39

## 2017-08-16 HISTORY — DX: Hyperlipidemia, unspecified: E78.5

## 2017-08-16 HISTORY — DX: Excessive and frequent menstruation with irregular cycle: N92.1

## 2017-08-16 HISTORY — DX: Ulcerative colitis, unspecified, without complications: K51.90

## 2017-08-16 SURGERY — HYSTEROSCOPY WITH NOVASURE
Anesthesia: General

## 2017-08-19 ENCOUNTER — Telehealth: Payer: Self-pay

## 2017-08-19 NOTE — Telephone Encounter (Signed)
Patient had Mirena IUD inserted 08/13/17. She called in voice mail today asking it she should abstain from sex?

## 2017-08-20 ENCOUNTER — Encounter: Payer: Self-pay | Admitting: Anesthesiology

## 2017-08-20 NOTE — Telephone Encounter (Signed)
Mirena IUD in place.  Good contraception.  Can be sexually active.  Condoms as needed for STI prevention.

## 2017-08-21 NOTE — Telephone Encounter (Signed)
Patient informed. 

## 2017-08-22 ENCOUNTER — Encounter: Payer: Self-pay | Admitting: Family Medicine

## 2017-09-10 ENCOUNTER — Ambulatory Visit (INDEPENDENT_AMBULATORY_CARE_PROVIDER_SITE_OTHER): Payer: BLUE CROSS/BLUE SHIELD | Admitting: Obstetrics & Gynecology

## 2017-09-10 ENCOUNTER — Encounter: Payer: Self-pay | Admitting: Obstetrics & Gynecology

## 2017-09-10 VITALS — BP 136/90

## 2017-09-10 DIAGNOSIS — Z30431 Encounter for routine checking of intrauterine contraceptive device: Secondary | ICD-10-CM

## 2017-09-10 NOTE — Progress Notes (Signed)
    Amy Coffey [birth year] [id number]        56 y.o.    RP:  F/U IUD check  HPI:  Stopped bleeding and spotting x about 2 weeks.  No pelvic pain.  Normal secretions.  No fever.  Past medical history,surgical history, problem list, medications, allergies, family history and social history were all reviewed and documented in the EPIC chart.  Directed ROS with pertinent positives and negatives documented in the history of present illness/assessment and plan.  Exam:  Vitals:   09/10/17 1516  BP: 136/90   General appearance:  Normal  Gyn exam:  Vulva normal.  Speculum:  Cervix normal.  Strings visible.  No erythema.  No bleeding, normal secretions.   Assessment/Plan:  56 y.o.   1. IUD check up Mirena IUD in good location, well tolerated and no sign of infection.  F/U Annual/Gyn exam 03/2018.  Princess Bruins MD, 3:51 PM 09/10/2017

## 2017-09-10 NOTE — Patient Instructions (Signed)
  1. IUD check up Mirena IUD in good location, well tolerated and no sign of infection.  F/U Annual/Gyn exam 03/2018.  Taejah, good to see you today!

## 2017-09-20 DIAGNOSIS — R945 Abnormal results of liver function studies: Secondary | ICD-10-CM | POA: Diagnosis not present

## 2017-09-20 DIAGNOSIS — K523 Indeterminate colitis: Secondary | ICD-10-CM | POA: Diagnosis not present

## 2017-09-20 DIAGNOSIS — D649 Anemia, unspecified: Secondary | ICD-10-CM | POA: Diagnosis not present

## 2017-09-20 DIAGNOSIS — R197 Diarrhea, unspecified: Secondary | ICD-10-CM | POA: Diagnosis not present

## 2017-09-20 DIAGNOSIS — K625 Hemorrhage of anus and rectum: Secondary | ICD-10-CM | POA: Diagnosis not present

## 2017-09-20 DIAGNOSIS — R300 Dysuria: Secondary | ICD-10-CM | POA: Diagnosis not present

## 2017-10-08 DIAGNOSIS — D649 Anemia, unspecified: Secondary | ICD-10-CM | POA: Diagnosis not present

## 2017-10-09 DIAGNOSIS — R8290 Unspecified abnormal findings in urine: Secondary | ICD-10-CM | POA: Diagnosis not present

## 2017-10-09 DIAGNOSIS — D649 Anemia, unspecified: Secondary | ICD-10-CM | POA: Diagnosis not present

## 2017-10-10 ENCOUNTER — Other Ambulatory Visit: Payer: Self-pay | Admitting: Physician Assistant

## 2017-10-10 ENCOUNTER — Ambulatory Visit
Admission: RE | Admit: 2017-10-10 | Discharge: 2017-10-10 | Disposition: A | Discharge status: Home / Self Care | Payer: 59 | Source: Ambulatory Visit | Attending: Physician Assistant | Admitting: Physician Assistant

## 2017-10-10 DIAGNOSIS — D649 Anemia, unspecified: Secondary | ICD-10-CM

## 2017-10-10 DIAGNOSIS — K529 Noninfective gastroenteritis and colitis, unspecified: Secondary | ICD-10-CM

## 2017-10-10 DIAGNOSIS — D72829 Elevated white blood cell count, unspecified: Secondary | ICD-10-CM

## 2017-10-10 DIAGNOSIS — K625 Hemorrhage of anus and rectum: Secondary | ICD-10-CM

## 2017-10-10 MED ORDER — IOPAMIDOL (ISOVUE-300) INJECTION 61%
125.0000 mL | Freq: Once | INTRAVENOUS | Status: AC | PRN
  Administered 2017-10-10: 125 mL via INTRAVENOUS

## 2017-10-11 ENCOUNTER — Ambulatory Visit: Payer: BLUE CROSS/BLUE SHIELD | Admitting: Obstetrics & Gynecology

## 2017-10-11 ENCOUNTER — Encounter: Payer: Self-pay | Admitting: Obstetrics & Gynecology

## 2017-10-11 VITALS — BP 130/84

## 2017-10-11 DIAGNOSIS — L723 Sebaceous cyst: Secondary | ICD-10-CM

## 2017-10-11 DIAGNOSIS — Z975 Presence of (intrauterine) contraceptive device: Secondary | ICD-10-CM | POA: Diagnosis not present

## 2017-10-11 NOTE — Progress Notes (Signed)
    Amy Coffey [birth year] [id number]        56 y.o.  G5P1A4 (Ectopics x 4, S/P Bilateral Salpingectomy)  RP:  Pubic cyst or ingrown hair  HPI:  Had an abscess at the pubic area.  Put warm compresses on it and it drained.  Per patient, increased WBC count at last visit with Fam MD.  CT scan neg.  No pelvic pain.  No vaginal d/c.  No fever.  Past medical history,surgical history, problem list, medications, allergies, family history and social history were all reviewed and documented in the EPIC chart.  Directed ROS with pertinent positives and negatives documented in the history of present illness/assessment and plan.  Exam:  Vitals:   10/11/17 1532  BP: 130/84   General appearance:  Normal  Gyn exam:  Pubic area:  Midline drained Sebaceous gland abscess.  Vulva normal.  Speculum:  Cervix/vagina normal.  IUD Strings visible.  Normal secretions.  Bimanual exam:  Uterus AV, normal volume/Mobile/NT.  No adnexal mass felt.   Assessment/Plan:  56 y.o.   1. Sebaceous cyst Spontaneous drainage post warm soaking.  Recommend Neosporin cream and avoidance of contact/friction during healing process.  Continue warm soaking as needed.  Reassured.  2. IUD contraception In good location, no evidence of infection.  Counseling on above issues >50% x 15 minutes  Princess Bruins MD, 3:42 PM 10/11/2017

## 2017-10-14 ENCOUNTER — Encounter: Payer: Self-pay | Admitting: Obstetrics & Gynecology

## 2017-10-14 NOTE — Patient Instructions (Signed)
1. Sebaceous cyst Spontaneous drainage post warm soaking.  Recommend Neosporin cream and avoidance of contact/friction during healing process.  Continue warm soaking as needed.  Reassured.  2. IUD contraception In good location, no evidence of infection.  Talea, it was a pleasure seeing you today!

## 2017-10-15 DIAGNOSIS — D72829 Elevated white blood cell count, unspecified: Secondary | ICD-10-CM | POA: Diagnosis not present

## 2017-10-31 DIAGNOSIS — K523 Indeterminate colitis: Secondary | ICD-10-CM | POA: Diagnosis not present

## 2017-11-08 ENCOUNTER — Encounter: Payer: Self-pay | Admitting: Family Medicine

## 2017-11-08 ENCOUNTER — Ambulatory Visit: Payer: BLUE CROSS/BLUE SHIELD | Admitting: Family Medicine

## 2017-11-08 VITALS — BP 168/98 | HR 82 | Ht 67.0 in | Wt 246.0 lb

## 2017-11-08 DIAGNOSIS — H6121 Impacted cerumen, right ear: Secondary | ICD-10-CM | POA: Diagnosis not present

## 2017-11-08 DIAGNOSIS — H6983 Other specified disorders of Eustachian tube, bilateral: Secondary | ICD-10-CM | POA: Diagnosis not present

## 2017-11-08 DIAGNOSIS — I10 Essential (primary) hypertension: Secondary | ICD-10-CM

## 2017-11-08 DIAGNOSIS — H6122 Impacted cerumen, left ear: Secondary | ICD-10-CM

## 2017-11-08 DIAGNOSIS — M25562 Pain in left knee: Secondary | ICD-10-CM | POA: Diagnosis not present

## 2017-11-08 MED ORDER — IPRATROPIUM BROMIDE 0.06 % NA SOLN
2.0000 | Freq: Four times a day (QID) | NASAL | 0 refills | Status: DC
Start: 2017-11-08 — End: 2019-02-06

## 2017-11-08 MED ORDER — METHYLPREDNISOLONE ACETATE 40 MG/ML IJ SUSP
40.0000 mg | Freq: Once | INTRAMUSCULAR | Status: AC
  Administered 2017-11-08: 40 mg via INTRA_ARTICULAR

## 2017-11-08 NOTE — Patient Instructions (Addendum)
Please place ice to your knee every 4-6 hours over the next couple of days.  You can continue using Tylenol as needed.  You can follow-up with our sports medicine physician, Dr. Paulla Fore, to discuss gel shots.  Start the atrovent. Continue the zyrtec.  Take care,  Dr Jerline Pain

## 2017-11-08 NOTE — Progress Notes (Signed)
   Subjective:  Amy Coffey is a 56 y.o. female who presents today with a chief complaint of right ear pain.   HPI:  Right Ear pressure, acute issue Started about a week ago. Worsening over that time. She had URI symptoms about a week ago including cough, rhinorrhea, sore throat, and nasal congestion.  She still has a little bit of nasal congestion.  Treatments tried include Zyrtec and eardrops which did not significantly seem to help.  No fevers or chills.  No other obvious alleviating or aggravating factors.  Left knee pain, acute issue Patient with a history of arthritis in her left knee.  She received a cortisone injection about 6 months ago which significantly helped with her symptoms.  Over the past week her symptoms have worsened.  No precipitating events.  No traumas or falls.  She has not tried any oral medications for her pain.  ROS: Per HPI  PMH: Smoking history reviewed. Former smoker.   Objective:  Physical Exam: BP (!) 168/98   Pulse 82   Ht 5\' 7"  (1.702 m)   Wt 246 lb (111.6 kg)   SpO2 99%   BMI 38.53 kg/m   Gen: NAD, resting comfortably HEENT: Right EAC with significant amount of cerumen.  TM obscured.  Left TM with clear effusion.  Maxillary sinuses with mildly decreased inflammation bilaterally.  Oropharynx clear.  No lymphadenopathy  -Left knee: No deformities.  Crepitus with active range of motion.  Full range of motion.  Mildly tender along the joint line.  Cerumen successfully irrigated by LPN.  TM visualized with clear effusion.  Knee Arthrocentesis with Injection Procedure Note  Pre-operative Diagnosis: left knee OA  Post-operative Diagnosis: same  Indications: Symptom relief from osteoarthritis  Anesthesia: Lidocaine 1% without epinephrine without added sodium bicarbonate  Procedure Details   Verbal consent was obtained for the procedure. The joint was prepped with Betadine. A 22 gauge needle was inserted into the superior aspect of the joint  from a lateral approach. 3 ml 2% lidocaine and 1 ml of 40mg /cc depomedrol was then injected into the joint. The needle was removed and the area cleansed and dressed.  Complications:  None; patient tolerated the procedure well.  Assessment/Plan:  Eustachian tube dysfunction Reassured patient.  Start Atrovent nasal spray.  Recommended patient start oral cetirizine daily for the next 1-2 weeks.  Cerumen impaction Successfully irrigated today.  Continue home Debrox.  Left knee pain Secondary to OA.  Steroid injection performed today.  See above procedure note.  Advised patient to place ice to the area.  Also recommended continued use of oral Tylenol.  Advised patient to follow-up with sports medicine or her PCP for repeat injections as needed.  Essential hypertension Elevated today in setting of acute pain.  Typically well controlled.  No indication to change medications today.  Advised continued home monitoring with goal blood pressure 140/90 or less.  Algis Greenhouse. Jerline Pain, MD 11/08/2017 4:33 PM

## 2017-11-15 ENCOUNTER — Other Ambulatory Visit: Payer: Self-pay | Admitting: Family Medicine

## 2017-11-29 ENCOUNTER — Telehealth: Payer: Self-pay | Admitting: Family Medicine

## 2017-11-29 NOTE — Telephone Encounter (Signed)
Please advise 

## 2017-11-29 NOTE — Telephone Encounter (Signed)
See note

## 2017-11-29 NOTE — Telephone Encounter (Signed)
Copied from [place] [phone]. Topic: Quick Communication - See Telephone Encounter >> Nov 29, 2017  2:00 PM Ether Griffins B wrote: CRM for notification. See Telephone encounter for:  Pt was seen by Dimas Chyle last and he was talking to her about a gel shot a sports med doc can give her and is needing to know the name of the shot and meds used for [employer] to see if insurance will cover it.  11/29/17.

## 2017-12-02 NOTE — Telephone Encounter (Signed)
Spoke with patient and she advised that she had recent MRI done with Raliegh Ip and she thinks that may have shown arthritis. Forwarding to Dr. Paulla Fore to review MRI.

## 2017-12-02 NOTE — Telephone Encounter (Signed)
Called [phone] and left VM for pt to call the office.

## 2017-12-02 NOTE — Telephone Encounter (Signed)
Pt will need to be evaluated for osteoarthritis of the LT knee prior to benefits verification.

## 2017-12-02 NOTE — Telephone Encounter (Signed)
Please call patient to advise   Copied from [place] [id number]. Topic: Quick Communication - Office Called Patient >> Dec 02, 2017 10:39 AM Hewitt Shorts wrote: Reason for CRM: pt returing the call to brandy coleman   Best number

## 2017-12-02 NOTE — Telephone Encounter (Signed)
Code 20611 ( inj w/ u/s) Monovisc: [id number] Orthovisc: [id number] Synvisc: [id number]  Will do benefits verification.

## 2017-12-04 NOTE — Telephone Encounter (Signed)
Spoke with patient and advised. She will need to call back to schedule OV.

## 2017-12-04 NOTE — Telephone Encounter (Signed)
There is no MRI available through [facility].  She has had x-rays performed of her bilateral knees in 2017 that showed only very mild degenerative changes.  I would like to see her before committing to Visco supplementation but we will plan to get her approved.

## 2017-12-04 NOTE — Telephone Encounter (Signed)
Report to Dr. Paulla Fore to review (regarding dx for visco supplementation benefits verification).

## 2017-12-04 NOTE — Telephone Encounter (Signed)
Pt called and said she is having Dr Noemi Chapel fax over the MRI results and she wants to wait until she makes the appt after the dr see's the results

## 2017-12-04 NOTE — Telephone Encounter (Signed)
See note

## 2017-12-06 ENCOUNTER — Telehealth: Payer: Self-pay | Admitting: Sports Medicine

## 2017-12-06 NOTE — Telephone Encounter (Signed)
Please see note below and advise.   Copied from [place] [id number]. Topic: Quick Communication - See Telephone Encounter >> Dec 06, 2017 11:20 AM Percell Belt A wrote: CRM for notification. See Telephone encounter for: pt called in and wanted to know if Theadora Rama rec'd her fax or her MRI to see if Dr Paulla Fore would be able to do the "Gel Shot"?  It came from [facility] and they faxed it wed 1/2  12/06/17.

## 2017-12-06 NOTE — Telephone Encounter (Signed)
Called pt and advised that MRI report of LT knee was received and reviewed and BV process has been initiated. I also advised that he would need to evaluate her prior to determining if the visco supplement was the most appropriate option for her. Pt verbalized understanding and will call back to schedule OV.

## 2017-12-09 ENCOUNTER — Ambulatory Visit: Payer: BLUE CROSS/BLUE SHIELD | Admitting: Family Medicine

## 2017-12-09 ENCOUNTER — Encounter: Payer: Self-pay | Admitting: Family Medicine

## 2017-12-09 VITALS — BP 132/86 | HR 79 | Temp 98.0°F | Resp 12 | Ht 67.0 in | Wt 252.4 lb

## 2017-12-09 DIAGNOSIS — Z6839 Body mass index (BMI) 39.0-39.9, adult: Secondary | ICD-10-CM | POA: Diagnosis not present

## 2017-12-09 DIAGNOSIS — R252 Cramp and spasm: Secondary | ICD-10-CM

## 2017-12-09 DIAGNOSIS — H6981 Other specified disorders of Eustachian tube, right ear: Secondary | ICD-10-CM | POA: Diagnosis not present

## 2017-12-09 DIAGNOSIS — E6609 Other obesity due to excess calories: Secondary | ICD-10-CM | POA: Diagnosis not present

## 2017-12-09 DIAGNOSIS — I10 Essential (primary) hypertension: Secondary | ICD-10-CM | POA: Diagnosis not present

## 2017-12-09 MED ORDER — LOSARTAN POTASSIUM-HCTZ 100-25 MG PO TABS: 1.0000 | ORAL_TABLET | ORAL | 2 refills | Status: DC

## 2017-12-09 NOTE — Progress Notes (Signed)
HPI:   [name] is a 57 y.o. female, who is here today to follow on some chronic medical problems. She was last seen on 813/2018.   Hypertension:   Chronic problem. Currently on Hyzaar 100/25 mg daily.   Last exam within a year ago, 01/2017. She is taking medications as instructed, no side effects reported. She is checking BP at home, readings are "normal." She has not noted unusual headache, visual changes, exertional chest pain, dyspnea,  focal weakness, or edema.   Lab Results  Component Value Date   CREATININE 0.84 08/02/2017   BUN 9 08/02/2017   NA 137 08/02/2017   K 3.9 08/02/2017   CL 101 08/02/2017   CO2 29 08/02/2017     Concerns today:   Cramps: Initially on thighs, left more than right then she started with bilateral calves cramps spreading down to her toes. She is a contributing problem to oral steroid, Budesonide, which was started to treat colitis symptoms (09/20/17 by GI at [facility]).   Alleviated by getting up and walking on the role, he lacks about 5 minutes or less.   Exacerbated by certain movements of the toes, mainly at night when she is in bed. Problem is improving after she discontinued Busonide treatment. Cramps in hands have resolved.  She has no noted associated edema or erythema.  Recently he was noted with upper respiratory infection.  She still having some "popping" sensation in ears, no earache.  Dysphonia, intermittently, no sore throat. She has no noted fever, chills, or fatigue.  Is not exercising regularly and has not been consistent with a healthy diet..   Review of Systems  Constitutional: Negative for activity change, appetite change, fatigue and fever.  HENT: Positive for congestion, postnasal drip, rhinorrhea and voice change. Negative for ear discharge, ear pain, hearing loss, mouth sores, nosebleeds, sore throat and trouble swallowing.   Eyes: Negative for redness and visual disturbance.    Respiratory: Negative for cough, shortness of breath and wheezing.   Cardiovascular: Negative for chest pain, palpitations and leg swelling.  Gastrointestinal: Negative for abdominal pain, nausea and vomiting.       Negative for changes in bowel habits.  Endocrine: Negative for cold intolerance and heat intolerance.  Genitourinary: Negative for decreased urine volume, dysuria and hematuria.  Musculoskeletal: Positive for myalgias.  Skin: Negative for rash and wound.  Allergic/Immunologic: Positive for environmental allergies.  Neurological: Negative for seizures, syncope, weakness, numbness and headaches.  Hematological: Negative for adenopathy. Does not bruise/bleed easily.  Psychiatric/Behavioral: Positive for sleep disturbance. Negative for confusion. The patient is not nervous/anxious.       Current Outpatient Medications on File Prior to Visit  Medication Sig Dispense Refill  . Cholecalciferol (VITAMIN D3) 1000 units CAPS Take 1 capsule by mouth daily.    Marland Kitchen FeFum-FePoly-FA-B Cmp-C-Biot (FOLIVANE-PLUS) CAPS TAKE ONE CAPSULE BY MOUTH EVERY DAY 90 capsule 0  . ipratropium (ATROVENT) 0.06 % nasal spray Place 2 sprays into both nostrils 4 (four) times daily. 15 mL 0  . mesalamine (APRISO) 0.375 g 24 hr capsule Take 1,500 mg by mouth every morning.     Marland Kitchen VOLTAREN 1 % GEL Apply 1 application topically as needed.   2  . ferrous sulfate 325 (65 FE) MG EC tablet Take 325 mg by mouth every morning.     No current facility-administered medications on file prior to visit.      Past Medical History:  Diagnosis Date  . Arthritis  knees  . Heart murmur    per pt had echo done yrs ago was told mild  . History of hyperprolactinemia    02/ 2018  resolved  . Hyperlipidemia   . Hypertension   . Iron deficiency anemia   . Menometrorrhagia    REFRACTORY TO PROGESTIN  . Ulcerative colitis ([place]) [facility]   per pt dx 1990s  . Wears glasses    No Known Allergies  Social  History   Socioeconomic History  . Marital status: Married    Spouse name: [name]  . Number of children: 1  . Years of education: BA  . Highest education level: None  Social Needs  . Financial resource strain: None  . Food insecurity - worry: None  . Food insecurity - inability: None  . Transportation needs - medical: None  . Transportation needs - non-medical: None  Occupational History  . Occupation: Airline pilot  Tobacco Use  . Smoking status: Former Smoker    Years: 10.00    Types: Cigarettes    Last attempt to quit: 12/03/1997    Years since quitting: 20.0  . Smokeless tobacco: Never Used  Substance and Sexual Activity  . Alcohol use: No  . Drug use: No  . Sexual activity: None  Other Topics Concern  . None  Social History Narrative   Lives with husband   Caffeine use: coffee daily   Pepsi on weekends    Vitals:   12/09/17 1600  BP: 132/86  Pulse: 79  Resp: 12  Temp: 98 F (36.7 C)  SpO2: 96%   Body mass index is 39.53 kg/m.  Wt Readings from Last 3 Encounters:  12/09/17 252 lb 6 oz (114.5 kg)  11/08/17 246 lb (111.6 kg)  07/29/17 249 lb (112.9 kg)    Physical Exam  Nursing note and vitals reviewed. Constitutional: She is oriented to person, place, and time. She appears well-developed. No distress.  HENT:  Head: Normocephalic and atraumatic.  Right Ear: Hearing and external ear normal. A middle ear effusion is present.  Left Ear: Hearing, tympanic membrane, external ear and ear canal normal.  Nose: Right sinus exhibits no maxillary sinus tenderness and no frontal sinus tenderness. Left sinus exhibits no maxillary sinus tenderness and no frontal sinus tenderness.  Mouth/Throat: Oropharynx is clear and moist and mucous membranes are normal.  Atrophic turbinates.  Eyes: Conjunctivae are normal. Pupils are equal, round, and reactive to light.  Neck: No tracheal deviation present. No thyroid mass and no thyromegaly present.  Cardiovascular: Normal rate and  regular rhythm.  No murmur heard. Pulses:      Dorsalis pedis pulses are 2+ on the right side, and 2+ on the left side.  Respiratory: Effort normal and breath sounds normal. No respiratory distress.  GI: Soft. She exhibits no mass. There is no hepatomegaly. There is no tenderness.  Musculoskeletal: She exhibits no edema or tenderness.       Right lower leg: She exhibits no tenderness.       Left lower leg: She exhibits no tenderness.  Lymphadenopathy:    She has no cervical adenopathy.  Neurological: She is alert and oriented to person, place, and time. She has normal strength. Gait normal.  Skin: Skin is warm. No rash noted. No erythema.  Psychiatric: She has a normal mood and affect.  Well groomed, good eye contact.     ASSESSMENT AND PLAN:   [name] was seen today for cramping in legs and feet and nasal  congestion.  Diagnoses and all orders for this visit:  Lab Results  Component Value Date   TSH 0.48 12/09/2017   Lab Results  Component Value Date   CREATININE 0.82 12/09/2017   BUN 11 12/09/2017   NA 140 12/09/2017   K 3.6 12/09/2017   CL 101 12/09/2017   CO2 29 12/09/2017    Cramps, muscle, general  Possible etiologies discussed. Problem has improved. Instructed about warning signs. Further recommendation will be given according to lab results.  -     Basic metabolic panel -     TSH -     CK  Hypertension, essential  Adequately controlled. No changes in current management. DASH diet recommended. Eye exam recommended annually. F/U in 6 months, before if needed.  -     Basic metabolic panel -     losartan-hydrochlorothiazide (HYZAAR) 100-25 MG tablet; Take 1 tablet by mouth every morning.  Eustachian tube dysfunction, right  Explained that congestion can last a few days and even weeks after URI. Auto inflation maneuvers recommended. Instructed about warning signs. Follow-up as needed.  Class 2 obesity due to excess calories without serious  comorbidity with body mass index (BMI) of 39.0 to 39.9 in adult  We discussed benefits of wt loss as well as adverse effects of obesity. Consistency with healthy diet and physical activity recommended.     -[name] was advised to return sooner than planned today if new concerns arise.       Betty G. Martinique, MD  [facility]. [place] office.

## 2017-12-09 NOTE — Patient Instructions (Addendum)
A few things to remember from today's visit:   Cramps, muscle, general - Plan: CK, Basic metabolic panel, TSH  Hypertension, essential - Plan: Basic metabolic panel, losartan-hydrochlorothiazide (HYZAAR) 100-25 MG tablet  Eustachian tube dysfunction, right   Muscle Cramps and Spasms Muscle cramps and spasms are when muscles tighten by themselves. They usually get better within minutes. Muscle cramps are painful. They are usually stronger and last longer than muscle spasms. Muscle spasms may or may not be painful. They can last a few seconds or much longer. Follow these instructions at home:  Drink enough fluid to keep your pee (urine) clear or pale yellow.  Massage, stretch, and relax the muscle.  If directed, apply heat to tight or tense muscles as often as told by your doctor. Use the heat source that your doctor recommends. ? Place a towel between your skin and the heat source. ? Leave the heat on for 20-30 minutes. ? Take off the heat if your skin turns bright red. This is especially important if you are unable to feel pain, heat, or cold. You may have a greater risk of getting burned.  If directed, put ice on the affected area. This may help if you are sore or have pain after a cramp or spasm. ? Put ice in a plastic bag. ? Place a towel between your skin and the bag. ? Leave the ice on for 20 minutes, 2-3 times a day.  Take over-the-counter and prescription medicines only as told by your doctor.  Pay attention to any changes in your symptoms. Contact a doctor if:  Your cramps or spasms get worse or happen more often.  Your cramps or spasms do not get better with time. This information is not intended to replace advice given to you by your health care provider. Make sure you discuss any questions you have with your health care provider. Document Released: 11/01/2008 Document Revised: 12/21/2015 Document Reviewed: 08/23/2015 Elsevier Interactive Patient Education  2018  [employer].  Please be sure medication list is accurate. If a new problem present, please set up appointment sooner than planned today.

## 2017-12-10 ENCOUNTER — Encounter: Payer: Self-pay | Admitting: Family Medicine

## 2017-12-10 LAB — TSH: TSH: 0.48 u[IU]/mL (ref 0.35–4.50)

## 2017-12-10 LAB — BASIC METABOLIC PANEL
BUN: 11 mg/dL (ref 6–23)
CHLORIDE: 101 meq/L (ref 96–112)
CO2: 29 mEq/L (ref 19–32)
Calcium: 9.6 mg/dL (ref 8.4–10.5)
Creatinine, Ser: 0.82 mg/dL (ref 0.40–1.20)
GFR: 93.15 mL/min (ref >60.00)
GLUCOSE: 78 mg/dL (ref 70–99)
POTASSIUM: 3.6 meq/L (ref 3.5–5.1)
SODIUM: 140 meq/L (ref 135–145)

## 2017-12-10 LAB — CK: Total CK: 76 U/L (ref 7–177)

## 2017-12-12 NOTE — Telephone Encounter (Signed)
Called pt and advised. Pt verbalized understanding and will call back to schedule OV.

## 2017-12-12 NOTE — Telephone Encounter (Signed)
She does have tricompartmental degenerative changes based on the MRI findings from [facility].  She has failed corticosteroid injections and is a candidate for Visco supplementation.  I am happy to do this for at her appointment.  Prior authorization and estimation of cost will need to be provided for her insurance preferred Visco supplement.

## 2017-12-12 NOTE — Telephone Encounter (Signed)
Monovisc is covered, $20 copay, deductible does not apply, once the OOP is met pt will have no financial responsibility.

## 2017-12-16 ENCOUNTER — Telehealth: Payer: Self-pay | Admitting: Family Medicine

## 2017-12-16 NOTE — Telephone Encounter (Signed)
Opened in error

## 2017-12-16 NOTE — Telephone Encounter (Signed)
Patient phoned for lab results that have been released to [place]. Reviewed results with patient.  No questions asked.

## 2017-12-16 NOTE — Telephone Encounter (Signed)
This encounter was created in error - please disregard.

## 2018-01-16 ENCOUNTER — Other Ambulatory Visit: Payer: Self-pay | Admitting: Family Medicine

## 2018-01-16 DIAGNOSIS — Z1231 Encounter for screening mammogram for malignant neoplasm of breast: Secondary | ICD-10-CM

## 2018-02-02 NOTE — Progress Notes (Signed)
HPI:   Amy Coffey is a 57 y.o. female, who is here today for her routine physical.  Last CPE: 2017.  She follows with gyn regularly, Dr Amy Coffey.  Regular exercise 3 or more time per week: Not consistently but plannin gon starting walking.  Following a healthy diet: She trying to do better.  She lives with her husband.  Chronic medical problems: HLD,HTN,obesity, and iron def anemia among some.    There is no immunization history on file for this patient.  Mammogram: 03/2017 Bi-rads 1 Colonoscopy: 2017, 3 years follow up was recommended because colitis.  Last pap smear in 2018.   Hep C screening: Denies risk factors but agrees with doing it today.   HLD: She is on non pharmacologic treatment. She would like FLP done today.  Lab Results  Component Value Date   CHOL 132 08/02/2017   HDL 41.40 08/02/2017   LDLCALC 70 08/02/2017   TRIG 102.0 08/02/2017   CHOLHDL 3 08/02/2017    Iron def anemia: She is on iron supplementation. IUD in 08/2017.  Lab Results  Component Value Date   WBC 13.0 (H) 08/02/2017   HGB 10.9 (L) 08/02/2017   HCT 34.5 (L) 08/02/2017   MCV 84.9 08/02/2017   PLT 545.0 (H) 08/02/2017    Right ear discomfort: She uses earphones at work. Right ear "little" pain exacerbated by loud nose. She has noted some ear drainage from right ear in the morning. This has been going on for the past couple weeks.  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 She denies hearing loss.    Review of Systems  Constitutional: Negative for appetite change, chills, fever and unexpected weight change.  HENT: Positive for ear discharge and ear pain. Negative for dental problem, hearing loss, mouth sores, trouble swallowing and voice change.   Eyes: Negative for redness and visual disturbance.  Respiratory: Negative for cough, shortness of breath and wheezing.   Cardiovascular: Negative for  chest pain and leg swelling.  Gastrointestinal: Negative for abdominal pain, nausea and vomiting.       No changes in bowel habits.  Endocrine: Negative for cold intolerance, heat intolerance, polydipsia, polyphagia and polyuria.  Genitourinary: Negative for decreased urine volume, dysuria, hematuria, vaginal bleeding and vaginal discharge.  Musculoskeletal: Positive for arthralgias. Negative for gait problem, joint swelling and myalgias.  Skin: Negative for color change and rash.  Allergic/Immunologic: Positive for environmental allergies.  Neurological: Negative for syncope, weakness and headaches.  Hematological: Negative for adenopathy. Does not bruise/bleed easily.  Psychiatric/Behavioral: Negative for confusion and sleep disturbance. The patient is not nervous/anxious.   All other systems reviewed and are negative.     Current Outpatient Medications on File Prior to Visit  Medication Sig Dispense Refill  . Cholecalciferol (VITAMIN D3) 1000 units CAPS Take 1 capsule by mouth daily.    . ferrous sulfate 325 (65 FE) MG EC tablet Take 325 mg by mouth every morning.    Marland Kitchen ipratropium (ATROVENT) 0.06 % nasal spray Place 2 sprays into both nostrils 4 (four) times daily. 15 mL 0  . losartan-hydrochlorothiazide (HYZAAR) 100-25 MG tablet Take 1 tablet by mouth every morning. 90 tablet 2  . mesalamine (APRISO) 0.375 g 24 hr capsule Take 1,500 mg by mouth every morning.     Marland Kitchen VOLTAREN 1 % GEL Apply 1 application topically as needed.   2  . FeFum-FePoly-FA-B Cmp-C-Biot (FOLIVANE-PLUS) CAPS  TAKE ONE CAPSULE BY MOUTH EVERY DAY (Patient not taking: Reported on 02/03/2018) 90 capsule 0   No current facility-administered medications on file prior to visit.      Past Medical History:  Diagnosis Date  . Arthritis    knees  . Heart murmur    per pt had echo done yrs ago was told mild  . History of hyperprolactinemia    02/ 2018  resolved  . Hyperlipidemia   . Hypertension   . Iron deficiency  anemia   . Menometrorrhagia    REFRACTORY TO PROGESTIN  . Ulcerative colitis ([place]) followed by [facility]   per pt dx 1990s  . Wears glasses     Past Surgical History:  Procedure Laterality Date  . CESAREAN SECTION  1988  . ECTOPIC PREGNANCY SURGERY  2002 approx.   right partial salpingectomy via lapartomy  . EXPLORATORY LAPAROTOMY W/ LEFT SALPINGECTOMY FOR ECTOPIC  03-31-2003   dr Lahoma Crocker    No Known Allergies  Family History  Problem Relation Age of Onset  . Angina Mother   . Thyroid disease Mother   . Arthritis Mother   . Heart disease Mother   . Hypertension Mother   . Hyperlipidemia Mother   . Prostate cancer Father   . Arthritis Father   . Cancer Father        prostate  . Hypertension Father   . Cancer Maternal Aunt        breast    Social History   Socioeconomic History  . Marital status: Married    Spouse name: [name]  . Number of children: 1  . Years of education: BA  . Highest education level: None  Social Needs  . Financial resource strain: None  . Food insecurity - worry: None  . Food insecurity - inability: None  . Transportation needs - medical: None  . Transportation needs - non-medical: None  Occupational History  . Occupation: Airline pilot  Tobacco Use  . Smoking status: Former Smoker    Years: 10.00    Types: Cigarettes    Last attempt to quit: 12/03/1997    Years since quitting: 20.1  . Smokeless tobacco: Never Used  Substance and Sexual Activity  . Alcohol use: No  . Drug use: No  . Sexual activity: None  Other Topics Concern  . None  Social History Narrative   Lives with husband   Caffeine use: coffee daily   Pepsi on weekends     Vitals:   02/03/18 0832  BP: 126/84  Pulse: 83  Resp: 12  Temp: 97.9 F (36.6 C)  SpO2: 97%   Body mass index is 39.82 kg/m.   Wt Readings from Last 3 Encounters:  02/03/18 254 lb 4 oz (115.3 kg)  12/09/17 252 lb 6 oz (114.5 kg)  11/08/17 246 lb (111.6 kg)      Physical Exam    Nursing note and vitals reviewed. Constitutional: She is oriented to person, place, and time. She appears well-developed. No distress.  HENT:  Head: Normocephalic and atraumatic.  Right Ear: Hearing, tympanic membrane, external ear and ear canal normal. No tenderness. Tympanic membrane is not erythematous and not bulging.  Left Ear: Hearing, tympanic membrane, external ear and ear canal normal.  Mouth/Throat: Uvula is midline, oropharynx is clear and moist and mucous membranes are normal.  Mild scaly right ear canal. No erythema.   Eyes: Conjunctivae and EOM are normal. Pupils are equal, round, and reactive to light.  Neck: No tracheal  deviation present. No thyroid mass and no thyromegaly present.  Cardiovascular: Normal rate and regular rhythm.  No murmur heard. Pulses:      Dorsalis pedis pulses are 2+ on the right side, and 2+ on the left side.  Respiratory: Effort normal and breath sounds normal. No respiratory distress.  GI: Soft. She exhibits no mass. There is no hepatomegaly. There is no tenderness.  Musculoskeletal: She exhibits no edema or tenderness.  No major deformity or signs of synovitis appreciated.  Lymphadenopathy:    She has no cervical adenopathy.       Right: No supraclavicular adenopathy present.       Left: No supraclavicular adenopathy present.  Neurological: She is alert and oriented to person, place, and time. She has normal strength. No cranial nerve deficit. Coordination and gait normal.  Reflex Scores:      Bicep reflexes are 2+ on the right side and 2+ on the left side.      Patellar reflexes are 2+ on the right side and 2+ on the left side. Skin: Skin is warm. No rash noted. No erythema.  Psychiatric: She has a normal mood and affect. Her speech is normal.  Well groomed, good eye contact.     ASSESSMENT AND PLAN:  Amy Coffey was here today annual physical examination.   Orders Placed This Encounter  Procedures  . Basic metabolic panel   . Lipid panel  . Hepatitis C antibody screen  . CBC   Lab Results  Component Value Date   WBC 13.7 (H) 02/03/2018   HGB 11.1 (L) 02/03/2018   HCT 34.5 (L) 02/03/2018   MCV 84.5 02/03/2018   PLT 561.0 (H) 02/03/2018   Lab Results  Component Value Date   CHOL 213 (H) 02/03/2018   HDL 39.70 02/03/2018   LDLCALC 155 (H) 02/03/2018   TRIG 92.0 02/03/2018   CHOLHDL 5 02/03/2018   Lab Results  Component Value Date   CREATININE 0.73 02/03/2018   BUN 8 02/03/2018   NA 139 02/03/2018   K 3.8 02/03/2018   CL 102 02/03/2018   CO2 29 02/03/2018     Routine general medical examination at a health care facility  We discussed the importance of regular physical activity and healthy diet for prevention of chronic illness and/or complications. Preventive guidelines reviewed. Vaccination up to date. She will continue following with her gyn for her female preventive care. Ca++ and vit D supplementation recommended. Next CPE in a year.  The 10-year ASCVD risk score Mikey Bussing DC Brooke Bonito., et al., 2013) is: 6.5%   Values used to calculate the score:     Age: 29 years     Sex: Female     Is Non-Hispanic African American: Yes     Diabetic: No     Tobacco smoker: No     Systolic Blood Pressure: 287 mmHg     Is BP treated: Yes     HDL Cholesterol: 39.7 mg/dL     Total Cholesterol: 213 mg/dL  Encounter for HCV screening test for high risk patient -     Hepatitis C antibody screen  Iron deficiency anemia, unspecified iron deficiency anemia type  Further recommendations will be given according to CBC results. Continue iron supplementation.  -     CBC  Diabetes mellitus screening -     Basic metabolic panel  Hyperlipidemia, unspecified hyperlipidemia type  Continue non pharmacologic treatment. We will follow labs done today and will give further recommendations accordingly.  -  Lipid panel  Noninfectious otitis externa of right ear, unspecified chronicity, unspecified  type  Mild. Recommend otic drops for 7 days then as needed.  Instructed about warning signs. F/U as needed.  -     acetic acid-hydrocortisone (VOSOL-HC) OTIC solution; Place 2 drops into the right ear 4 (four) times daily.      Return in 6 months (on 08/06/2018) for HTN.        Alejandria Wessells G. Martinique, MD  [facility]. [place] office.

## 2018-02-03 ENCOUNTER — Ambulatory Visit (INDEPENDENT_AMBULATORY_CARE_PROVIDER_SITE_OTHER): Payer: BLUE CROSS/BLUE SHIELD | Admitting: Family Medicine

## 2018-02-03 ENCOUNTER — Encounter: Payer: Self-pay | Admitting: Family Medicine

## 2018-02-03 VITALS — BP 126/84 | HR 83 | Temp 97.9°F | Resp 12 | Ht 67.0 in | Wt 254.2 lb

## 2018-02-03 DIAGNOSIS — D509 Iron deficiency anemia, unspecified: Secondary | ICD-10-CM

## 2018-02-03 DIAGNOSIS — E785 Hyperlipidemia, unspecified: Secondary | ICD-10-CM | POA: Diagnosis not present

## 2018-02-03 DIAGNOSIS — Z0000 Encounter for general adult medical examination without abnormal findings: Secondary | ICD-10-CM

## 2018-02-03 DIAGNOSIS — Z1159 Encounter for screening for other viral diseases: Secondary | ICD-10-CM | POA: Diagnosis not present

## 2018-02-03 DIAGNOSIS — Z9189 Other specified personal risk factors, not elsewhere classified: Secondary | ICD-10-CM

## 2018-02-03 DIAGNOSIS — H608X1 Other otitis externa, right ear: Secondary | ICD-10-CM | POA: Diagnosis not present

## 2018-02-03 DIAGNOSIS — Z131 Encounter for screening for diabetes mellitus: Secondary | ICD-10-CM

## 2018-02-03 LAB — BASIC METABOLIC PANEL
BUN: 8 mg/dL (ref 6–23)
CHLORIDE: 102 meq/L (ref 96–112)
CO2: 29 mEq/L (ref 19–32)
Calcium: 9.3 mg/dL (ref 8.4–10.5)
Creatinine, Ser: 0.73 mg/dL (ref 0.40–1.20)
GFR: 106.46 mL/min (ref >60.00)
GLUCOSE: 89 mg/dL (ref 70–99)
POTASSIUM: 3.8 meq/L (ref 3.5–5.1)
SODIUM: 139 meq/L (ref 135–145)

## 2018-02-03 LAB — LIPID PANEL
CHOLESTEROL: 213 mg/dL — AB (ref 0–200)
HDL: 39.7 mg/dL (ref >39.00)
LDL CALC: 155 mg/dL — AB (ref 0–99)
NonHDL: 173.07
Total CHOL/HDL Ratio: 5
Triglycerides: 92 mg/dL (ref 0.0–149.0)
VLDL: 18.4 mg/dL (ref 0.0–40.0)

## 2018-02-03 LAB — CBC
HEMATOCRIT: 34.5 % — AB (ref 36.0–46.0)
Hemoglobin: 11.1 g/dL — ABNORMAL LOW (ref 12.0–15.0)
MCHC: 32.2 g/dL (ref 30.0–36.0)
MCV: 84.5 fl (ref 78.0–100.0)
PLATELETS: 561 10*3/uL — AB (ref 150.0–400.0)
RBC: 4.08 Mil/uL (ref 3.87–5.11)
RDW: 15.3 % (ref 11.5–15.5)
WBC: 13.7 10*3/uL — ABNORMAL HIGH (ref 4.0–10.5)

## 2018-02-03 MED ORDER — HYDROCORTISONE-ACETIC ACID 1-2 % OT SOLN: 2.0000 [drp] | Freq: Four times a day (QID) | OTIC | 0 refills | Status: DC

## 2018-02-03 NOTE — Patient Instructions (Addendum)
A few things to remember from today's visit:   Routine general medical examination at a health care facility  Encounter for HCV screening test for high risk patient - Plan: Hepatitis C antibody screen  Iron deficiency anemia, unspecified iron deficiency anemia type - Plan: CBC  Diabetes mellitus screening - Plan: Basic metabolic panel  Hyperlipidemia, unspecified hyperlipidemia type - Plan: Lipid panel  Noninfectious otitis externa of right ear, unspecified chronicity, unspecified type - Plan: acetic acid-hydrocortisone (VOSOL-HC) OTIC solution  Today you have you routine preventive visit.  At least 150 minutes of moderate exercise per week, daily brisk walking for 15-30 min is a good exercise option. Healthy diet low in saturated (animal) fats and sweets and consisting of fresh fruits and vegetables, lean meats such as fish and white chicken and whole grains.  These are some of recommendations for screening depending of age and risk factors:   - Vaccines:  Tdap vaccine every 10 years.  Shingles vaccine recommended at age 74, could be given after 55 years of age but not sure about insurance coverage.   Pneumonia vaccines:  Prevnar 13 at 65 and Pneumovax at 50. Sometimes Pneumovax is giving earlier if history of smoking, lung disease,diabetes,kidney disease among some.    Screening for diabetes at age 36 and every 3 years.  Cervical cancer prevention:  Pap smear starts at 55 years of age and continues periodically until 55 years old in low risk women. Pap smear every 3 years between 76 and 49 years old. Pap smear every 3-5 years between women 31 and older if pap smear negative and HPV screening negative.   -Breast cancer: Mammogram: There is disagreement between experts about when to start screening in low risk asymptomatic female but recent recommendations are to start screening at 2 and not later than 55 years old , every 1-2 years and after 55 yo q 2 years. Screening is  recommended until 55 years old but some women can continue screening depending of healthy issues.   Colon cancer screening: starts at 55 years old until 55 years old.  Also recommended:  1. Dental visit- Brush and floss your teeth twice daily; visit your dentist twice a year. 2. Eye doctor- Get an eye exam at least every 2 years. 3. Helmet use- Always wear a helmet when riding a bicycle, motorcycle, rollerblading or skateboarding. 4. Safe sex- If you may be exposed to sexually transmitted infections, use a condom. 5. Seat belts- Seat belts can save your live; always wear one. 6. Smoke/Carbon Monoxide detectors- These detectors need to be installed on the appropriate level of your home. Replace batteries at least once a year. 7. Skin cancer- When out in the sun please cover up and use sunscreen 15 SPF or higher. 8. Violence- If anyone is threatening or hurting you, please tell your healthcare provider.  9. Drink alcohol in moderation- Limit alcohol intake to one drink or less per day. Never drink and drive.  Please be sure medication list is accurate. If a new problem present, please set up appointment sooner than planned today.

## 2018-02-04 ENCOUNTER — Encounter: Payer: Self-pay | Admitting: Family Medicine

## 2018-02-04 ENCOUNTER — Other Ambulatory Visit: Payer: Self-pay | Admitting: *Deleted

## 2018-02-04 DIAGNOSIS — D509 Iron deficiency anemia, unspecified: Secondary | ICD-10-CM

## 2018-02-04 LAB — HEPATITIS C ANTIBODY
Hepatitis C Ab: NONREACTIVE
SIGNAL TO CUT-OFF: 0.02 (ref <1.00)

## 2018-02-06 ENCOUNTER — Telehealth: Payer: Self-pay | Admitting: *Deleted

## 2018-02-06 NOTE — Telephone Encounter (Signed)
Copied from [place] [id number]. Topic: Referral - Question >> Feb 06, 2018  9:48 AM Conception Chancy, NT wrote: Patient states she has been referred to a hematologist in [place] and she is requesting to be sent to one in [place]. Please advise.

## 2018-02-07 ENCOUNTER — Inpatient Hospital Stay: Payer: BLUE CROSS/BLUE SHIELD | Admitting: Oncology

## 2018-02-07 NOTE — Telephone Encounter (Signed)
Patient called yesterday evening and left msg to cancel her appt. I returned patient call and was advised that she did not want to be seen in [place].  Patient advised of the referral rerouted to [facility] for scheduling. Referral routed to [facility], due to patient lives in [place] and per Patient request.

## 2018-02-11 ENCOUNTER — Encounter: Payer: Self-pay | Admitting: Hematology and Oncology

## 2018-02-11 ENCOUNTER — Telehealth: Payer: Self-pay | Admitting: Hematology and Oncology

## 2018-02-11 NOTE — Telephone Encounter (Signed)
Appt has been scheduled for the pt to see Dr. Lindi Adie on 4/1 at 345pm. Per pt request she needed a late afternoon appt. Letter mailed.

## 2018-02-21 ENCOUNTER — Ambulatory Visit: Payer: BLUE CROSS/BLUE SHIELD | Admitting: Obstetrics & Gynecology

## 2018-02-21 ENCOUNTER — Encounter: Payer: Self-pay | Admitting: Obstetrics & Gynecology

## 2018-02-21 VITALS — BP 136/88

## 2018-02-21 DIAGNOSIS — Z975 Presence of (intrauterine) contraceptive device: Secondary | ICD-10-CM

## 2018-02-21 DIAGNOSIS — N920 Excessive and frequent menstruation with regular cycle: Secondary | ICD-10-CM

## 2018-02-21 NOTE — Progress Notes (Signed)
    Amy Coffey [birth year] [id number]        57 y.o.  No obstetric history on file.   RP:  Longer menstrual period x 1 on Mirena IUD  HPI: Mirena IUD inserted August 13, 2017.  Patient has done very well on its with light menses every month for 2-3 days.  Except for this for the last period in March which lasted 13 days with heavier flow for a few days.  The bleeding stopped yesterday.  Patient noticed that prior to the period, she had stronger PMS symptoms than usual with tender breasts bilaterally.  No pelvic pain.  Normal vaginal secretions.  No fever.   OB History  No data available    Past medical history,surgical history, problem list, medications, allergies, family history and social history were all reviewed and documented in the EPIC chart.   Directed ROS with pertinent positives and negatives documented in the history of present illness/assessment and plan.  Exam:  Vitals:   02/21/18 1629  BP: 136/88   General appearance:  Normal  Abdomen: Normal  Gynecologic exam: Vulva normal.  Speculum: Cervix normal with IUD strings visible.  Normal vagina.  Normal vaginal secretions with no bleeding.     Assessment/Plan:  57 y.o. No obstetric history on file.   1. Menorrhagia with regular cycle Doing very well on Mirena IUD since September 2018.  Only had a heavier and longer menstrual period in March, probably secondary to a stronger ovulation.  The possibility of using the progestin only pill as needed in the future discussed with patient.  Decision to observe at this time.  Patient reassured that the Mirena IUD is in good position.  2. IUD contraception Well on Mirena IUD which is in good position with no sign of infection.  Will observe.  Counseling on above issues and coordination of care more than 50% for 15 minutes.  Princess Bruins MD, 4:46 PM 02/21/2018

## 2018-02-21 NOTE — Patient Instructions (Signed)
1. Menorrhagia with regular cycle Doing very well on Mirena IUD since September 2018.  Only had a heavier and longer menstrual period in March, probably secondary to a stronger ovulation.  The possibility of using the progestin only pill as needed in the future discussed with patient.  Decision to observe at this time.  Patient reassured that the Mirena IUD is in good position.  2. IUD contraception Well on Mirena IUD which is in good position with no sign of infection.  Will observe.  Cabrini, good seeing you today!

## 2018-02-24 ENCOUNTER — Telehealth: Payer: Self-pay | Admitting: *Deleted

## 2018-02-24 NOTE — Telephone Encounter (Signed)
Pt asked for your recommendation for a cream to rub under her breast c/o sweating and irritation in this area. please advise

## 2018-02-25 NOTE — Telephone Encounter (Signed)
Actually recommend to prescribe Nystatin powder.

## 2018-02-26 MED ORDER — NYSTATIN POWD: 1 refills | Status: DC

## 2018-02-26 NOTE — Telephone Encounter (Signed)
Left on voicemail Rx sent 

## 2018-03-03 ENCOUNTER — Inpatient Hospital Stay: Payer: BLUE CROSS/BLUE SHIELD | Attending: Hematology and Oncology | Admitting: Hematology and Oncology

## 2018-03-03 ENCOUNTER — Inpatient Hospital Stay: Payer: BLUE CROSS/BLUE SHIELD

## 2018-03-03 VITALS — BP 139/77 | HR 81 | Temp 98.4°F | Resp 18 | Ht 67.0 in | Wt 253.4 lb

## 2018-03-03 DIAGNOSIS — D509 Iron deficiency anemia, unspecified: Secondary | ICD-10-CM | POA: Insufficient documentation

## 2018-03-03 DIAGNOSIS — M1990 Unspecified osteoarthritis, unspecified site: Secondary | ICD-10-CM | POA: Insufficient documentation

## 2018-03-03 DIAGNOSIS — Z79899 Other long term (current) drug therapy: Secondary | ICD-10-CM | POA: Diagnosis not present

## 2018-03-03 DIAGNOSIS — K529 Noninfective gastroenteritis and colitis, unspecified: Secondary | ICD-10-CM | POA: Diagnosis not present

## 2018-03-03 DIAGNOSIS — R011 Cardiac murmur, unspecified: Secondary | ICD-10-CM | POA: Diagnosis not present

## 2018-03-03 DIAGNOSIS — N920 Excessive and frequent menstruation with regular cycle: Secondary | ICD-10-CM | POA: Diagnosis not present

## 2018-03-03 DIAGNOSIS — E785 Hyperlipidemia, unspecified: Secondary | ICD-10-CM | POA: Insufficient documentation

## 2018-03-03 DIAGNOSIS — Z803 Family history of malignant neoplasm of breast: Secondary | ICD-10-CM | POA: Insufficient documentation

## 2018-03-03 DIAGNOSIS — I10 Essential (primary) hypertension: Secondary | ICD-10-CM | POA: Diagnosis not present

## 2018-03-03 DIAGNOSIS — Z8719 Personal history of other diseases of the digestive system: Secondary | ICD-10-CM | POA: Insufficient documentation

## 2018-03-03 DIAGNOSIS — D649 Anemia, unspecified: Secondary | ICD-10-CM

## 2018-03-03 DIAGNOSIS — D473 Essential (hemorrhagic) thrombocythemia: Secondary | ICD-10-CM

## 2018-03-03 DIAGNOSIS — D573 Sickle-cell trait: Secondary | ICD-10-CM | POA: Diagnosis not present

## 2018-03-03 DIAGNOSIS — D696 Thrombocytopenia, unspecified: Secondary | ICD-10-CM | POA: Insufficient documentation

## 2018-03-03 DIAGNOSIS — M129 Arthropathy, unspecified: Secondary | ICD-10-CM | POA: Insufficient documentation

## 2018-03-03 DIAGNOSIS — Z87891 Personal history of nicotine dependence: Secondary | ICD-10-CM | POA: Diagnosis not present

## 2018-03-03 DIAGNOSIS — Z8042 Family history of malignant neoplasm of prostate: Secondary | ICD-10-CM | POA: Diagnosis not present

## 2018-03-03 LAB — IRON AND TIBC
IRON: 59 ug/dL (ref 28–170)
SATURATION RATIOS: 18 % (ref 10.4–31.8)
TIBC: 336 ug/dL (ref 250–450)
UIBC: 277 ug/dL

## 2018-03-03 LAB — RETICULOCYTES
RBC.: 4.13 MIL/uL (ref 3.70–5.45)
Retic Count, Absolute: 53.7 10*3/uL (ref 33.7–90.7)
Retic Ct Pct: 1.3 % (ref 0.7–2.1)

## 2018-03-03 LAB — FERRITIN: FERRITIN: 91 ng/mL (ref 11–307)

## 2018-03-03 LAB — CBC WITH DIFFERENTIAL (CANCER CENTER ONLY)
BASOS ABS: 0.1 10*3/uL (ref 0.0–0.1)
BASOS PCT: 1 %
EOS ABS: 0.2 10*3/uL (ref 0.0–0.5)
EOS PCT: 1 %
HCT: 35.9 % (ref 34.8–46.6)
Hemoglobin: 11.3 g/dL — ABNORMAL LOW (ref 11.6–15.9)
LYMPHS PCT: 33 %
Lymphs Abs: 4.3 10*3/uL — ABNORMAL HIGH (ref 0.9–3.3)
MCH: 27.4 pg (ref 25.1–34.0)
MCHC: 31.5 g/dL (ref 31.5–36.0)
MCV: 86.9 fL (ref 79.5–101.0)
MONO ABS: 0.7 10*3/uL (ref 0.1–0.9)
Monocytes Relative: 6 %
Neutro Abs: 7.9 10*3/uL — ABNORMAL HIGH (ref 1.5–6.5)
Neutrophils Relative %: 59 %
PLATELETS: 538 10*3/uL — AB (ref 145–400)
RBC: 4.13 MIL/uL (ref 3.70–5.45)
RDW: 15.2 % — AB (ref 11.2–14.5)
WBC: 13.2 10*3/uL — AB (ref 3.9–10.3)

## 2018-03-03 LAB — LACTATE DEHYDROGENASE: LDH: 170 U/L (ref 125–245)

## 2018-03-03 LAB — VITAMIN B12: Vitamin B-12: 554 pg/mL (ref 180–914)

## 2018-03-03 NOTE — Progress Notes (Signed)
[facility] NOTE  Patient Care Team: [place], Betty G, MD as PCP - General (Family Medicine) Lahoma Rocker, MD as Referring Physician (Rheumatology) Barbaraann Cao, OD as Referring Physician (Optometry)  CHIEF COMPLAINTS/PURPOSE OF CONSULTATION:  Anemia and thrombocytosis  HISTORY OF PRESENTING ILLNESS:  Amy Coffey 57 y.o. female is here because of recent diagnosis of anemia and thrombocytosis.  Patient has had long-standing episodes of anemia due to heavy menstrual bleeding.  She used to go for 13 days every month.  She now has an intrauterine device in the since then her periods have slowed down somewhat.  Because of this iron deficient anemia, she was given oral iron therapy.  She takes it twice a day.  She denies any fevers chills night sweats or weight loss.  She works for [employer].  Previous blood work done on 10/08/2017: WBC 18.7, hemoglobin 11.6, platelets 587  I reviewed her records extensively and collaborated the history with the patient.   MEDICAL HISTORY:  Past Medical History:  Diagnosis Date  . Arthritis    knees  . Heart murmur    per pt had echo done yrs ago was told mild  . History of hyperprolactinemia    02/ 2018  resolved  . Hyperlipidemia   . Hypertension   . Iron deficiency anemia   . Menometrorrhagia    REFRACTORY TO PROGESTIN  . Ulcerative colitis ([place]) followed by [facility]   per pt dx 1990s  . Wears glasses     SURGICAL HISTORY: Past Surgical History:  Procedure Laterality Date  . CESAREAN SECTION  1988  . ECTOPIC PREGNANCY SURGERY  2002 approx.   right partial salpingectomy via lapartomy  . EXPLORATORY LAPAROTOMY W/ LEFT SALPINGECTOMY FOR ECTOPIC  03-31-2003   dr Lahoma Crocker    SOCIAL HISTORY: Social History   Socioeconomic History  . Marital status: Married    Spouse name: [name]  . Number of children: 1  . Years of education: BA  . Highest education level: Not on file   Occupational History  . Occupation: Diplomatic Services operational officer  . Financial resource strain: Not on file  . Food insecurity:    Worry: Not on file    Inability: Not on file  . Transportation needs:    Medical: Not on file    Non-medical: Not on file  Tobacco Use  . Smoking status: Former Smoker    Years: 10.00    Types: Cigarettes    Last attempt to quit: 12/03/1997    Years since quitting: 20.2  . Smokeless tobacco: Never Used  Substance and Sexual Activity  . Alcohol use: No  . Drug use: No  . Sexual activity: Not on file  Lifestyle  . Physical activity:    Days per week: Not on file    Minutes per session: Not on file  . Stress: Not on file  Relationships  . Social connections:    Talks on phone: Not on file    Gets together: Not on file    Attends religious service: Not on file    Active member of club or organization: Not on file    Attends meetings of clubs or organizations: Not on file    Relationship status: Not on file  . Intimate partner violence:    Fear of current or ex partner: Not on file    Emotionally abused: Not on file    Physically abused: Not on file  Forced sexual activity: Not on file  Other Topics Concern  . Not on file  Social History Narrative   Lives with husband   Caffeine use: coffee daily   [employer] on weekends    FAMILY HISTORY: Family History  Problem Relation Age of Onset  . Angina Mother   . Thyroid disease Mother   . Arthritis Mother   . Heart disease Mother   . Hypertension Mother   . Hyperlipidemia Mother   . Prostate cancer Father   . Arthritis Father   . Cancer Father        prostate  . Hypertension Father   . Cancer Maternal Aunt        breast    ALLERGIES:  has No Known Allergies.  MEDICATIONS:  Current Outpatient Medications  Medication Sig Dispense Refill  . acetic acid-hydrocortisone (VOSOL-HC) OTIC solution Place 2 drops into the right ear 4 (four) times daily. 10 mL 0  . Cholecalciferol (VITAMIN D3) 1000 units  CAPS Take 1 capsule by mouth daily.    . ferrous sulfate 325 (65 FE) MG EC tablet Take 325 mg by mouth every morning.    Marland Kitchen ipratropium (ATROVENT) 0.06 % nasal spray Place 2 sprays into both nostrils 4 (four) times daily. 15 mL 0  . losartan-hydrochlorothiazide (HYZAAR) 100-25 MG tablet Take 1 tablet by mouth every morning. 90 tablet 2  . mesalamine (APRISO) 0.375 g 24 hr capsule Take 1,500 mg by mouth every morning.     Marland Kitchen Nystatin POWD Apply small amount to affected area 1 Bottle 1  . VOLTAREN 1 % GEL Apply 1 application topically as needed.   2   No current facility-administered medications for this visit.     REVIEW OF SYSTEMS:   Constitutional: Denies fevers, chills or abnormal night sweats Eyes: Denies blurriness of vision, double vision or watery eyes Ears, nose, mouth, throat, and face: Denies mucositis or sore throat Respiratory: Denies cough, dyspnea or wheezes Cardiovascular: Denies palpitation, chest discomfort or lower extremity swelling Gastrointestinal:  Denies nausea, heartburn or change in bowel habits Skin: Denies abnormal skin rashes Lymphatics: Denies new lymphadenopathy or easy bruising Neurological:Denies numbness, tingling or new weaknesses Behavioral/Psych: Mood is stable, no new changes  Breast:  Denies any palpable lumps or discharge All other systems were reviewed with the patient and are negative.  PHYSICAL EXAMINATION: ECOG PERFORMANCE STATUS: 1 - Symptomatic but completely ambulatory  Vitals:   03/03/18 1534  BP: 139/77  Pulse: 81  Resp: 18  Temp: 98.4 F (36.9 C)  SpO2: 100%   Filed Weights   03/03/18 1534  Weight: 253 lb 6.4 oz (114.9 kg)    GENERAL:alert, no distress and comfortable SKIN: skin color, texture, turgor are normal, no rashes or significant lesions EYES: normal, conjunctiva are pink and non-injected, sclera clear OROPHARYNX:no exudate, no erythema and lips, buccal mucosa, and tongue normal  NECK: supple, thyroid normal size,  non-tender, without nodularity LYMPH:  no palpable lymphadenopathy in the cervical, axillary or inguinal LUNGS: clear to auscultation and percussion with normal breathing effort HEART: regular rate & rhythm and no murmurs and no lower extremity edema ABDOMEN:abdomen soft, non-tender and normal bowel sounds Musculoskeletal:no cyanosis of digits and no clubbing  PSYCH: alert & oriented x 3 with fluent speech NEURO: no focal motor/sensory deficits BREAST: No palpable nodules in breast. No palpable axillary or supraclavicular lymphadenopathy (exam performed in the presence of a chaperone)   LABORATORY DATA:  I have reviewed the data as listed Lab Results  Component Value Date   WBC 13.2 (H) 03/03/2018   HGB 11.1 (L) 02/03/2018   HCT 35.9 03/03/2018   MCV 86.9 03/03/2018   PLT 538 (H) 03/03/2018   Lab Results  Component Value Date   NA 139 02/03/2018   K 3.8 02/03/2018   CL 102 02/03/2018   CO2 29 02/03/2018    RADIOGRAPHIC STUDIES: I have personally reviewed the radiological reports and agreed with the findings in the report.  ASSESSMENT AND PLAN:  Normocytic anemia Patient has had long-standing history of iron deficiency anemia for which she takes oral iron therapy intermittently.  She is currently on oral iron therapy.  She was noted to have mild anemia with a hemoglobin of 11.1.  I that she also had elevation of platelet count.  She normally runs hemoglobin of 11.6 and this is a sudden decrease and this was the reason why she was referred to [place]. Patient reports that recently she has had episodes of colitis as well as arthritis.  Differential diagnosis: 1. Anemia of chronic disease  2. combined B12 and iron deficiency anemias  3.  Anemia due to inflammation/infection 4. Patient has sickle cell trait:  I would like to check hemoglobin electrophoresis.  Today's blood work was consistent with her previous blood works over the past 7-8 months. WBC 13.2, platelets 538 Mild  increase in WBC count and an increase in platelet count suggestive of inflammation Hemoglobin 11.1 today.  Absolute reticulocyte count: 53.7 I will call her with results of hemoglobin electrophoresis and N79 folic acid and iron studies. I do not see any reason to perform bone marrow biopsy or perform any interventions unless she has significant decrease in the hemoglobin to below 10 along with other cytopenias.  Return to clinic on an as-needed basis.  All questions were answered. The patient knows to call the clinic with any problems, questions or concerns.    Harriette Ohara, MD 03/03/18

## 2018-03-03 NOTE — Assessment & Plan Note (Signed)
Patient has had long-standing history of iron deficiency anemia for which she takes oral iron therapy intermittently.  She is currently on oral iron therapy.  She was noted to have mild anemia with a hemoglobin of 11.1.  I that she also had elevation of platelet count.  She normally runs hemoglobin of 11.6 and this is a sudden decrease and this was the reason why she was referred to [place]. Patient reports that recently she has had episodes of colitis as well as arthritis.  Differential diagnosis: 1. Anemia of chronic disease  2. combined B12 and iron deficiency anemias  3.  Anemia due to inflammation/infection 4. Patient has sickle cell trait:  I would like to check hemoglobin electrophoresis. Patient was sent to the lab to get blood work drawn and I will see her back to discuss the results

## 2018-03-04 LAB — HAPTOGLOBIN: HAPTOGLOBIN: 142 mg/dL (ref 34–200)

## 2018-03-04 LAB — FOLATE RBC
FOLATE, HEMOLYSATE: 438.8 ng/mL
Folate, RBC: 1272 ng/mL (ref >498)
Hematocrit: 34.5 % (ref 34.0–46.6)

## 2018-03-05 ENCOUNTER — Telehealth: Payer: Self-pay | Admitting: Hematology and Oncology

## 2018-03-05 LAB — HEMOGLOBINOPATHY EVALUATION
Hgb A2 Quant: 3.4 % — ABNORMAL HIGH (ref 1.8–3.2)
Hgb A: 62.7 % — ABNORMAL LOW (ref 96.4–98.8)
Hgb C: 0 %
Hgb F Quant: 0.7 % (ref 0.0–2.0)
Hgb S Quant: 33.2 % — ABNORMAL HIGH
Hgb Variant: 0 %

## 2018-03-05 NOTE — Telephone Encounter (Signed)
I informed the patient the results of her blood work.  She had normal iron studies normal O14 folic acid, normal hemolysis workup.  Her hemoglobin was 11.3 Hemoglobin electrophoresis confirms sickle cell trait with hemoglobin S of 33%. I informed her that if her hemoglobin drops below 10 then we would need to see her back but otherwise she does not need to be seen on a routine basis.

## 2018-03-14 ENCOUNTER — Ambulatory Visit: Payer: BLUE CROSS/BLUE SHIELD

## 2018-04-02 ENCOUNTER — Ambulatory Visit
Admission: RE | Admit: 2018-04-02 | Discharge: 2018-04-02 | Disposition: A | Discharge status: Home / Self Care | Payer: BLUE CROSS/BLUE SHIELD | Source: Ambulatory Visit | Attending: Family Medicine | Admitting: Family Medicine

## 2018-04-02 DIAGNOSIS — Z1231 Encounter for screening mammogram for malignant neoplasm of breast: Secondary | ICD-10-CM | POA: Diagnosis not present

## 2018-06-11 ENCOUNTER — Ambulatory Visit: Payer: BLUE CROSS/BLUE SHIELD | Admitting: Sports Medicine

## 2018-07-28 ENCOUNTER — Ambulatory Visit: Payer: BLUE CROSS/BLUE SHIELD | Admitting: Sports Medicine

## 2018-08-11 IMAGING — MR MR HEAD WO/W CM
15 of 19 series · 34 of 48 positions shown · IV contrast (multihance)
Comparison: None.

CLINICAL DATA: Hyperprolactinemia

Creatinine was obtained on site at [HOSPITAL] at [HOSPITAL].
Results: Creatinine 0.8 mg/dL.
EXAM:
MRI HEAD WITHOUT AND WITH CONTRAST
TECHNIQUE: Multiplanar, multiecho pulse sequences of the brain and surrounding
structures were obtained without and with intravenous contrast.
CONTRAST:  15mL MULTIHANCE GADOBENATE DIMEGLUMINE 529 MG/ML IV SOLN

[Series 2: t1_se_sag · sagittal · 5.0mm · 0.45mm/px · 1 of 19 slices shown]
[im 1/19]
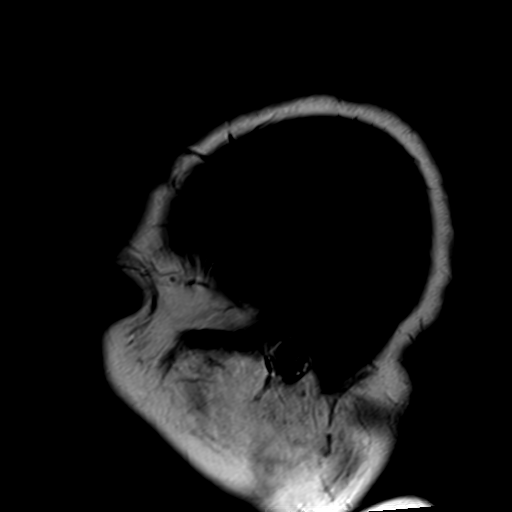

[Series 3: ep2d_diff_(id)_trace · axial · 3.0mm · 1.80mm/px · z∈[-43,+104]mm · 8 of 98 slices shown]
[im 1/98]
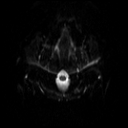
[im 20/98]
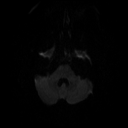
[im 30/98]
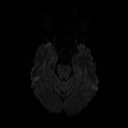
[im 39/98]
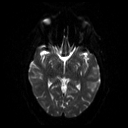
[im 59/98]
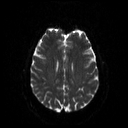
[im 68/98]
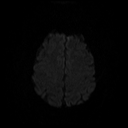
[im 78/98]
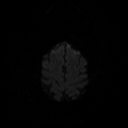
[im 98/98]
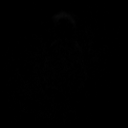

[Series 4: ep2d_diff_(id)_trace_adc · axial · 3.0mm · 1.80mm/px · z∈[-43,+104]mm · 5 of 50 slices shown]
[im 1/50]
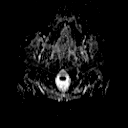
[im 13/50]
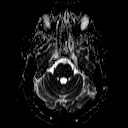
[im 25/50]
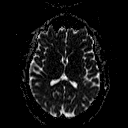
[im 37/50]
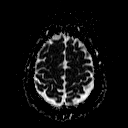
[im 50/50]
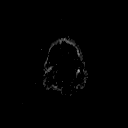

[Series 5: T2 · axial · 5.0mm · 0.45mm/px · z∈[-38,+98]mm · 2 of 22 slices shown]
[im 1/22]
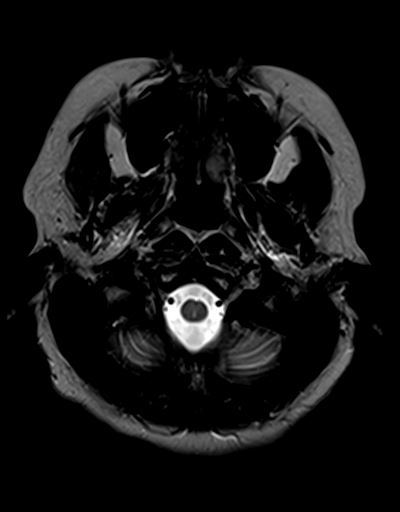
[im 22/22]
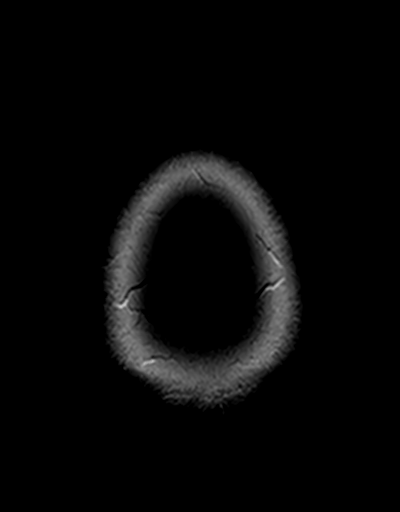

[Series 6: FLAIR · axial · 3.0mm · 0.43mm/px · z∈[-38,+97]mm · 5 of 46 slices shown]
[im 1/46]
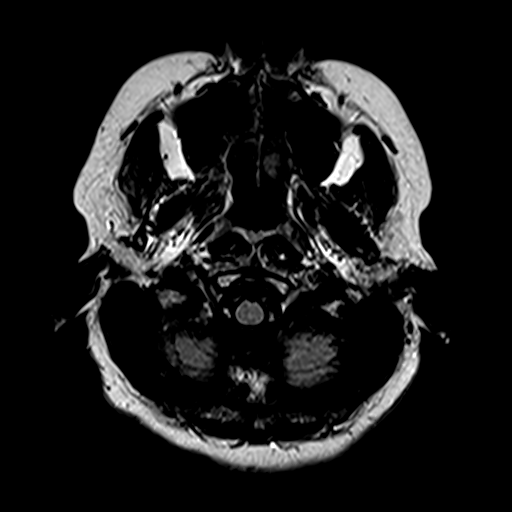
[im 12/46]
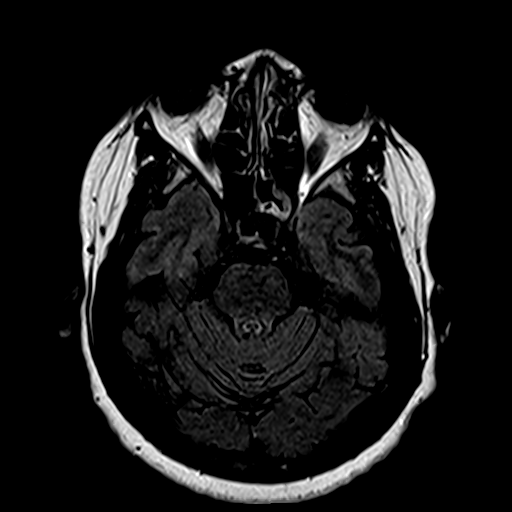
[im 23/46]
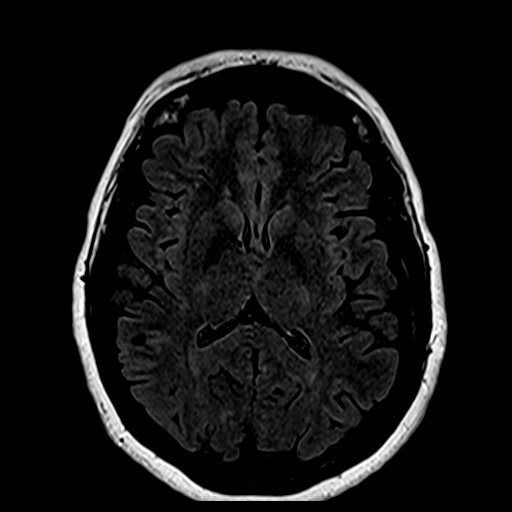
[im 34/46]
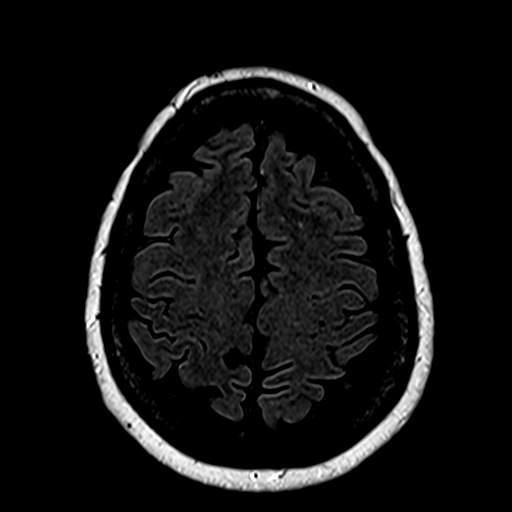
[im 46/46]
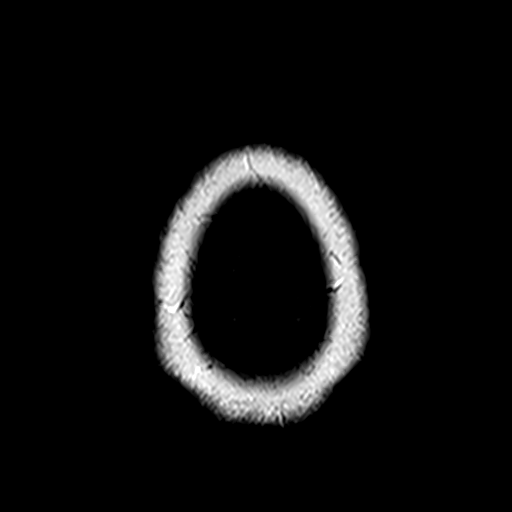

[Series 7: GRE · axial · 5.0mm · 0.45mm/px · z∈[-38,+98]mm · 2 of 22 slices shown]
[im 1/22]
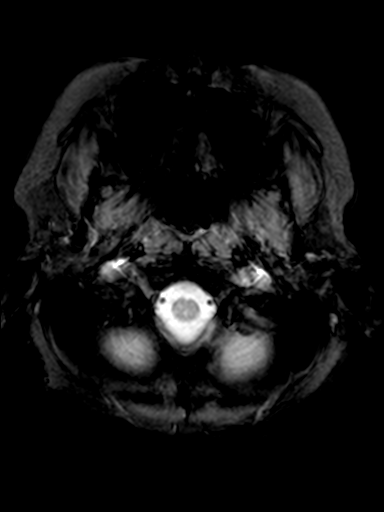
[im 22/22]
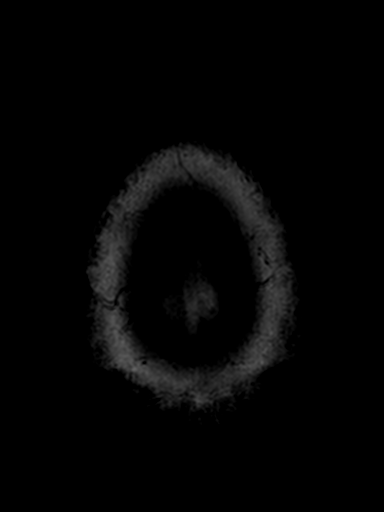

[Series 8: T1 · sagittal · 3.0mm · 0.35mm/px · 1 of 11 slices shown (1 of 2)]
[im 1/11]
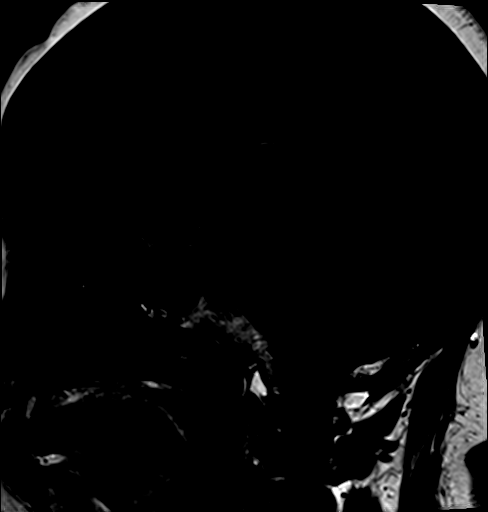

[Series 9: T1 · coronal · 3.0mm · 0.35mm/px · 1 of 11 slices shown (2 of 2)]
[im 1/11]
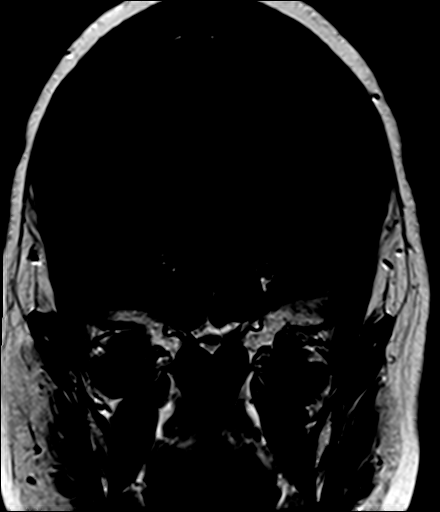

[Series 10: pre cor · coronal · non-contrast · 3.0mm · 0.35mm/px · 1 of 6 slices shown]
[im 1/6]
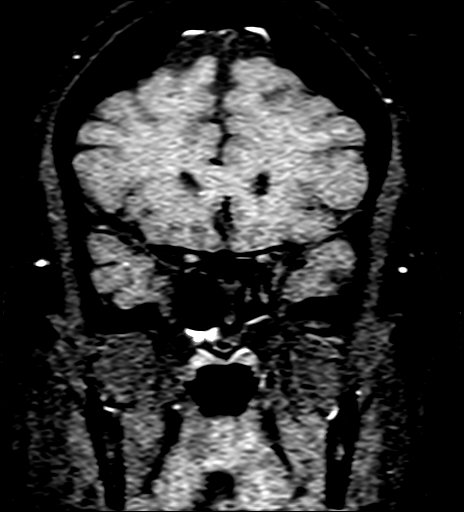

[Series 11: post cor dynamic · coronal · 3.0mm · 0.35mm/px · 1 of 6 slices shown (1 of 3)]
[im 1/6]
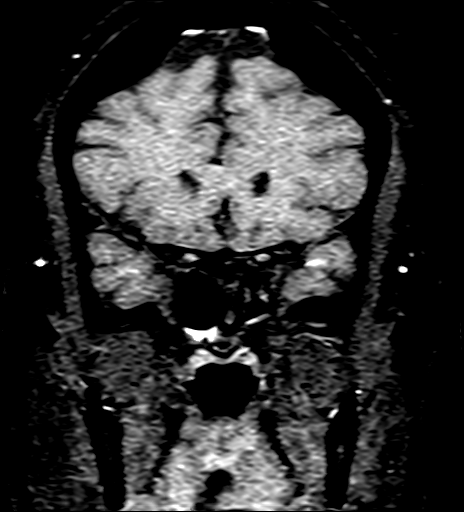

[Series 12: post cor dynamic · coronal · 3.0mm · 0.35mm/px · 1 of 6 slices shown (2 of 3)]
[im 1/6]
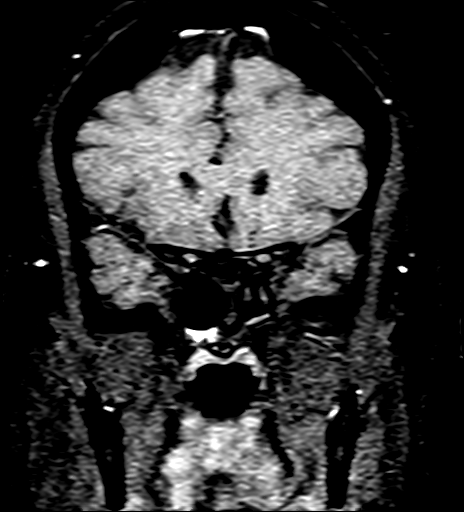

[Series 13: post cor dynamic · coronal · 3.0mm · 0.35mm/px · 1 of 6 slices shown (3 of 3)]
[im 1/6]
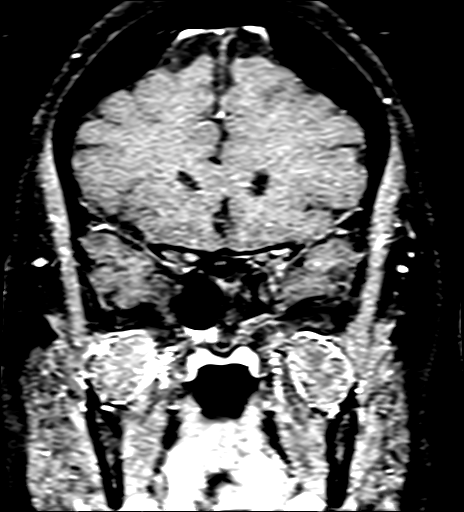

[Series 17: T1 post-contrast · coronal · 3.0mm · 0.35mm/px · 1 of 11 slices shown (1 of 3)]
[im 1/11]
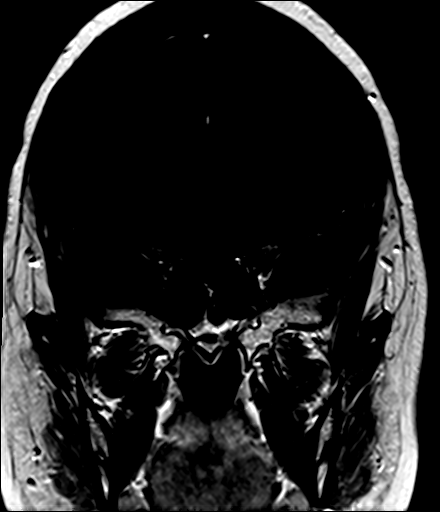

[Series 18: T1 post-contrast · sagittal · 3.0mm · 0.35mm/px · 1 of 11 slices shown (2 of 3)]
[im 1/11]
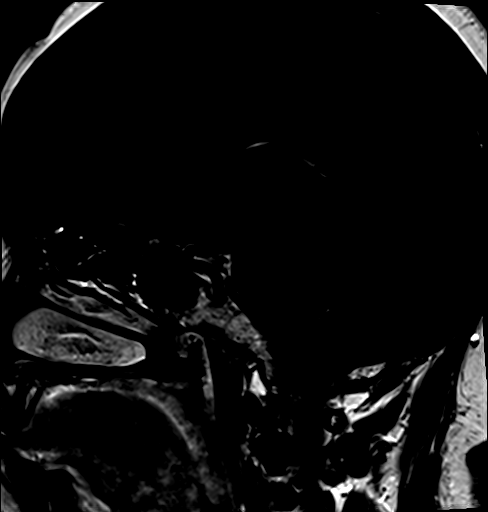

[Series 20: T1 post-contrast · coronal · 5.0mm · 0.69mm/px · 3 of 25 slices shown (3 of 3)]
[im 1/25]
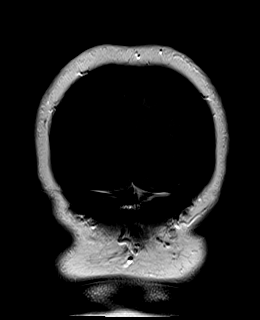
[im 13/25]
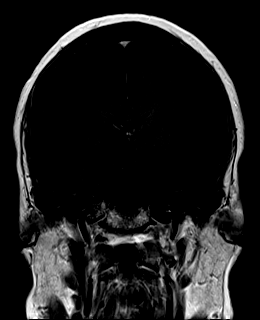
[im 25/25]
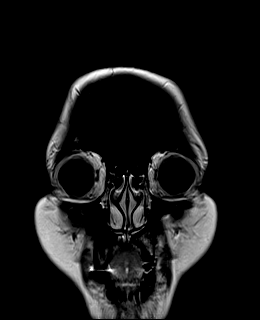

[34 of 48 positions shown; findings below may reference images not displayed]

FINDINGS: Brain: Dynamic pituitary protocol performed.

The sella is enlarged and filled with CSF. Small pituitary in the
floor of the sella measuring approximately 2.5 mm in thickness.
Pituitary tissue enhances homogeneous without microadenoma.
Infundibulum midline. Optic chiasm normal. Cavernous sinus normal.

Small white matter hyperintensities bilaterally. Negative for acute
infarct. Brainstem and cerebellum normal. Negative for hemorrhage or
mass.

Postcontrast imaging of the whole brain demonstrates normal
enhancement.

Vascular: Normal arterial flow voids.

Skull and upper cervical spine: Negative

Sinuses/Orbits: Mild mucosal edema in the paranasal sinuses.
Distended optic nerve sheaths bilaterally can be seen with elevated
intracranial pressure. Correlate with fundoscopic exam. No mass
lesion in the orbit.

Other: None
IMPRESSION: Negative for pituitary microadenoma. Enlarged sella compatible with
empty sella. Small pituitary enhances homogeneously.

Small white matter hyperintensities bilaterally may be due to
migraine headache or chronic microvascular ischemia.

Distended optic nerve sheaths bilaterally can be seen with elevated
intracranial pressure.

## 2018-09-30 ENCOUNTER — Other Ambulatory Visit: Payer: Self-pay | Admitting: Family Medicine

## 2018-09-30 DIAGNOSIS — E785 Hyperlipidemia, unspecified: Secondary | ICD-10-CM

## 2018-10-02 DIAGNOSIS — R7989 Other specified abnormal findings of blood chemistry: Secondary | ICD-10-CM | POA: Diagnosis not present

## 2018-10-02 DIAGNOSIS — K523 Indeterminate colitis: Secondary | ICD-10-CM | POA: Diagnosis not present

## 2018-10-02 DIAGNOSIS — R112 Nausea with vomiting, unspecified: Secondary | ICD-10-CM | POA: Diagnosis not present

## 2018-10-10 ENCOUNTER — Telehealth: Payer: Self-pay | Admitting: Family Medicine

## 2018-10-10 DIAGNOSIS — I10 Essential (primary) hypertension: Secondary | ICD-10-CM

## 2018-10-10 NOTE — Telephone Encounter (Signed)
Please advise Dr Volanda Napoleon if you are able to make this change, patient has been waiting over a week. Thanks.

## 2018-10-10 NOTE — Telephone Encounter (Signed)
Pt's provider out of office.  Unclear if she received request from pharmacy.  Ok to split the meds.  Send in 30 d supply of each with 1 refill.

## 2018-10-10 NOTE — Telephone Encounter (Signed)
Copied from [place] [phone]. Topic: Quick Communication - See Telephone Encounter >> Oct 10, 2018 12:57 PM Conception Chancy, NT wrote: CRM for notification. See Telephone encounter for: 10/10/18.  Patient is calling and states she needs a refill on losartan-hydrochlorothiazide (HYZAAR) 100-25 MG tablet. She states the pharmacy told her they sent the request last week and is needing approval to split this medication into 2 pills. Please advise.  [id number] [place], [place] - [address] [place] [address] Phone: [phone] Fax: [phone]

## 2018-10-10 NOTE — Telephone Encounter (Signed)
Copied from [place] [id number]. Topic: Quick Communication - Rx Refill/Question >> Oct 10, 2018  1:12 PM Burchel, Abbi R wrote: Medication: losartan-hydrochlorothiazide (HYZAAR) 100-25 MG tablet  Preferred Pharmacy: [id number] - [place], [place] - [address] [place] [address] Phone: [phone] Fax: [phone]  Pharmacy needs rx ok to fill seperately.

## 2018-10-13 ENCOUNTER — Other Ambulatory Visit: Payer: Self-pay | Admitting: *Deleted

## 2018-10-13 ENCOUNTER — Other Ambulatory Visit: Payer: Self-pay

## 2018-10-13 DIAGNOSIS — I10 Essential (primary) hypertension: Secondary | ICD-10-CM

## 2018-10-13 MED ORDER — LOSARTAN POTASSIUM 100 MG PO TABS: 100.0000 mg | ORAL_TABLET | Freq: Every day | ORAL | 3 refills | Status: DC

## 2018-10-13 MED ORDER — HYDROCHLOROTHIAZIDE 25 MG PO TABS: 25.0000 mg | ORAL_TABLET | Freq: Every day | ORAL | 3 refills | Status: DC

## 2018-10-13 MED ORDER — LOSARTAN POTASSIUM-HCTZ 100-25 MG PO TABS: 1.0000 | ORAL_TABLET | ORAL | 1 refills | Status: DC

## 2018-10-13 NOTE — Telephone Encounter (Signed)
Please advise 

## 2018-10-13 NOTE — Telephone Encounter (Signed)
Rx's sent for Losartan 100 mg daily and HCTZ 25 mg daily as requested.

## 2019-02-03 ENCOUNTER — Other Ambulatory Visit: Payer: Self-pay | Admitting: Family Medicine

## 2019-02-03 DIAGNOSIS — E785 Hyperlipidemia, unspecified: Secondary | ICD-10-CM

## 2019-02-06 ENCOUNTER — Ambulatory Visit (INDEPENDENT_AMBULATORY_CARE_PROVIDER_SITE_OTHER): Payer: BLUE CROSS/BLUE SHIELD | Admitting: Family Medicine

## 2019-02-06 ENCOUNTER — Encounter: Payer: Self-pay | Admitting: Family Medicine

## 2019-02-06 VITALS — BP 122/84 | HR 68 | Temp 98.5°F | Resp 12 | Ht 67.0 in | Wt 228.0 lb

## 2019-02-06 DIAGNOSIS — E785 Hyperlipidemia, unspecified: Secondary | ICD-10-CM

## 2019-02-06 DIAGNOSIS — Z13228 Encounter for screening for other metabolic disorders: Secondary | ICD-10-CM

## 2019-02-06 DIAGNOSIS — I10 Essential (primary) hypertension: Secondary | ICD-10-CM

## 2019-02-06 DIAGNOSIS — Z130 Encounter for screening for diseases of the blood and blood-forming organs and certain disorders involving the immune mechanism: Secondary | ICD-10-CM

## 2019-02-06 DIAGNOSIS — R748 Abnormal levels of other serum enzymes: Secondary | ICD-10-CM

## 2019-02-06 DIAGNOSIS — Z0000 Encounter for general adult medical examination without abnormal findings: Secondary | ICD-10-CM

## 2019-02-06 DIAGNOSIS — Z1329 Encounter for screening for other suspected endocrine disorder: Secondary | ICD-10-CM

## 2019-02-06 LAB — LIPID PANEL
CHOLESTEROL: 201 mg/dL — AB (ref 0–200)
HDL: 42.2 mg/dL (ref >39.00)
LDL Cholesterol: 136 mg/dL — ABNORMAL HIGH (ref 0–99)
NonHDL: 159.02
TRIGLYCERIDES: 115 mg/dL (ref 0.0–149.0)
Total CHOL/HDL Ratio: 5
VLDL: 23 mg/dL (ref 0.0–40.0)

## 2019-02-06 LAB — COMPREHENSIVE METABOLIC PANEL
ALBUMIN: 4.3 g/dL (ref 3.5–5.2)
ALT: 26 U/L (ref 0–35)
AST: 19 U/L (ref 0–37)
Alkaline Phosphatase: 154 U/L — ABNORMAL HIGH (ref 39–117)
BUN: 12 mg/dL (ref 6–23)
CALCIUM: 9.6 mg/dL (ref 8.4–10.5)
CO2: 31 mEq/L (ref 19–32)
CREATININE: 0.78 mg/dL (ref 0.40–1.20)
Chloride: 99 mEq/L (ref 96–112)
GFR: 92.45 mL/min (ref >60.00)
Glucose, Bld: 81 mg/dL (ref 70–99)
POTASSIUM: 3.9 meq/L (ref 3.5–5.1)
Sodium: 138 mEq/L (ref 135–145)
TOTAL PROTEIN: 7.5 g/dL (ref 6.0–8.3)
Total Bilirubin: 0.6 mg/dL (ref 0.2–1.2)

## 2019-02-06 MED ORDER — LOSARTAN POTASSIUM-HCTZ 100-25 MG PO TABS: 1.0000 | ORAL_TABLET | ORAL | 3 refills | Status: DC

## 2019-02-06 MED ORDER — ROSUVASTATIN CALCIUM 10 MG PO TABS: ORAL_TABLET | ORAL | 2 refills | Status: DC

## 2019-02-06 NOTE — Assessment & Plan Note (Signed)
She has not been compliant with medication, recommend trying to take Crestor 10 mg daily. Further recommendation will be given according to lipid panel results.

## 2019-02-06 NOTE — Patient Instructions (Signed)
A few things to remember from today's visit:   Routine general medical examination at a health care facility  Hyperlipidemia, unspecified hyperlipidemia type - Plan: rosuvastatin (CRESTOR) 10 MG tablet, Comprehensive metabolic panel, Lipid panel  Hypertension, essential - Plan: losartan-hydrochlorothiazide (HYZAAR) 100-25 MG tablet, Comprehensive metabolic panel  Screening for endocrine, metabolic and immunity disorder - Plan: Comprehensive metabolic panel  Today you have you routine preventive visit.  At least 150 minutes of moderate exercise per week, daily brisk walking for 15-30 min is a good exercise option. Healthy diet low in saturated (animal) fats and sweets and consisting of fresh fruits and vegetables, lean meats such as fish and white chicken and whole grains.  These are some of recommendations for screening depending of age and risk factors:   - Vaccines:  Tdap vaccine every 10 years.  Shingles vaccine recommended at age 56, could be given after 56 years of age but not sure about insurance coverage.   Pneumonia vaccines:  Prevnar 13 at 65 and Pneumovax at 33. Sometimes Pneumovax is giving earlier if history of smoking, lung disease,diabetes,kidney disease among some.    Screening for diabetes at age 33 and every 3 years.  Cervical cancer prevention:  Pap smear starts at 56 years of age and continues periodically until 56 years old in low risk women. Pap smear every 3 years between 51 and 78 years old. Pap smear every 3-5 years between women 37 and older if pap smear negative and HPV screening negative.   -Breast cancer: Mammogram: There is disagreement between experts about when to start screening in low risk asymptomatic female but recent recommendations are to start screening at 85 and not later than 56 years old , every 1-2 years and after 55 yo q 2 years. Screening is recommended until 56 years old but some women can continue screening depending of healthy  issues.   Colon cancer screening: starts at 56 years old until 56 years old.  Also recommended:  1. Dental visit- Brush and floss your teeth twice daily; visit your dentist twice a year. 2. Eye doctor- Get an eye exam at least every 2 years. 3. Helmet use- Always wear a helmet when riding a bicycle, motorcycle, rollerblading or skateboarding. 4. Safe sex- If you may be exposed to sexually transmitted infections, use a condom. 5. Seat belts- Seat belts can save your live; always wear one. 6. Smoke/Carbon Monoxide detectors- These detectors need to be installed on the appropriate level of your home. Replace batteries at least once a year. 7. Skin cancer- When out in the sun please cover up and use sunscreen 15 SPF or higher. 8. Violence- If anyone is threatening or hurting you, please tell your healthcare provider.  9. Drink alcohol in moderation- Limit alcohol intake to one drink or less per day. Never drink and drive.  Please be sure medication list is accurate. If a new problem present, please set up appointment sooner than planned today.

## 2019-02-06 NOTE — Assessment & Plan Note (Signed)
BP adequately controlled. No changes in current management. She would like to continue following annually instead every 6 months. Recommend monitoring BP at home periodically. Eye exam is current. Continue low-salt diet.

## 2019-02-06 NOTE — Progress Notes (Signed)
HPI:   Amy Coffey is a 58 y.o. female, who is here today for her routine physical.  Last CPE: 02/03/18  Regular exercise 3 or more time per week: She has not been consistent but for the past 6 months she is "trying" to do better,walking. Following a healthful diet: Not consistently. She lives with her husband.  Chronic medical problems: HTN,HLD,iron def anemia (follows with Dr Lindi Adie), OA.  Pap smear 01/2016. She follows with gyn and planning on arranging appt.   There is no immunization history on file for this patient.  Mammogram: 04/2018. Colonoscopy: 11/2016, 3 years f/u was recommended. DEXA: N/A  Hep C screening: NR 01/2018.  She has no concerns today.  Chronic disease management:  HLD,she is on Crestor 10 mg, forgets to take med frequently. She has not had side effects.  Component     Latest Ref Rng & Units 02/03/2018  Cholesterol     0 - 200 mg/dL 213 (H)  Triglycerides     0.0 - 149.0 mg/dL 92.0  HDL Cholesterol     >39.00 mg/dL 39.70  VLDL     0.0 - 40.0 mg/dL 18.4  LDL (calc)     0 - 99 mg/dL 155 (H)  Total CHOL/HDL Ratio      5  NonHDL      173.07   HTN: Currently she is on Losartan-HCTZ 50-12.5 mg daily. Denies severe/frequent headache, visual changes, chest pain, dyspnea, palpitation, claudication, focal weakness, or edema.  Component     Latest Ref Rng & Units 02/03/2018  Sodium     135 - 145 mEq/L 139  Potassium     3.5 - 5.1 mEq/L 3.8  Chloride     96 - 112 mEq/L 102  CO2     19 - 32 mEq/L 29  Glucose     70 - 99 mg/dL 89  BUN     6 - 23 mg/dL 8  Creatinine     0.40 - 1.20 mg/dL 0.73  Calcium     8.4 - 10.5 mg/dL 9.3  GFR     >60.00 mL/min 106.46    Review of Systems  Constitutional: Negative for appetite change, fatigue and fever.  HENT: Negative for dental problem, hearing loss, mouth sores, sore throat, trouble swallowing and voice change.   Eyes: Negative for redness and visual disturbance.  Respiratory:  Negative for cough, shortness of breath and wheezing.   Cardiovascular: Negative for chest pain and leg swelling.  Gastrointestinal: Negative for abdominal pain, nausea and vomiting.       No changes in bowel habits.  Endocrine: Negative for cold intolerance, heat intolerance, polydipsia, polyphagia and polyuria.  Genitourinary: Negative for decreased urine volume, dysuria, hematuria, vaginal bleeding and vaginal discharge.  Musculoskeletal: Negative for gait problem and myalgias. Intermittent arthralgias. Skin: Negative for color change and rash.  Allergic/Immunologic: Negative for environmental allergies.  Neurological: Negative for syncope, weakness and headaches.  Hematological: Negative for adenopathy. Does not bruise/bleed easily.  Psychiatric/Behavioral: Negative for confusion and sleep disturbance. The patient is not nervous/anxious.   All other systems reviewed and are negative.     Current Outpatient Medications on File Prior to Visit  Medication Sig Dispense Refill  . ferrous sulfate 325 (65 FE) MG EC tablet Take 325 mg by mouth every morning.    . mesalamine (APRISO) 0.375 g 24 hr capsule Take 1,500 mg by mouth every morning.     Marland Kitchen Nystatin POWD Apply small  amount to affected area 1 Bottle 1  . VOLTAREN 1 % GEL Apply 1 application topically as needed.   2  . Cholecalciferol (VITAMIN D3) 1000 units CAPS Take 1 capsule by mouth daily.     No current facility-administered medications on file prior to visit.      Past Medical History:  Diagnosis Date  . Arthritis    knees  . Heart murmur    per pt had echo done yrs ago was told mild  . History of hyperprolactinemia    02/ 2018  resolved  . Hyperlipidemia   . Hypertension   . Iron deficiency anemia   . Menometrorrhagia    REFRACTORY TO PROGESTIN  . Ulcerative colitis ([place]) followed by [facility]   per pt dx 1990s  . Wears glasses     Past Surgical History:  Procedure Laterality Date  . CESAREAN SECTION  1988  .  ECTOPIC PREGNANCY SURGERY  2002 approx.   right partial salpingectomy via lapartomy  . EXPLORATORY LAPAROTOMY W/ LEFT SALPINGECTOMY FOR ECTOPIC  03-31-2003   dr Lahoma Crocker    No Known Allergies  Family History  Problem Relation Age of Onset  . Angina Mother   . Thyroid disease Mother   . Arthritis Mother   . Heart disease Mother   . Hypertension Mother   . Hyperlipidemia Mother   . Prostate cancer Father   . Arthritis Father   . Cancer Father        prostate  . Hypertension Father   . Cancer Maternal Aunt        breast  . Breast cancer Maternal Aunt   . Breast cancer Cousin     Social History   Socioeconomic History  . Marital status: Married    Spouse name: [name]  . Number of children: 1  . Years of education: BA  . Highest education level: Not on file  Occupational History  . Occupation: Diplomatic Services operational officer  . Financial resource strain: Not on file  . Food insecurity:    Worry: Not on file    Inability: Not on file  . Transportation needs:    Medical: Not on file    Non-medical: Not on file  Tobacco Use  . Smoking status: Former Smoker    Years: 10.00    Types: Cigarettes    Last attempt to quit: 12/03/1997    Years since quitting: 21.1  . Smokeless tobacco: Never Used  Substance and Sexual Activity  . Alcohol use: No  . Drug use: No  . Sexual activity: Not on file  Lifestyle  . Physical activity:    Days per week: Not on file    Minutes per session: Not on file  . Stress: Not on file  Relationships  . Social connections:    Talks on phone: Not on file    Gets together: Not on file    Attends religious service: Not on file    Active member of club or organization: Not on file    Attends meetings of clubs or organizations: Not on file    Relationship status: Not on file  Other Topics Concern  . Not on file  Social History Narrative   Lives with husband   Caffeine use: coffee daily   [employer] on weekends     Vitals:   02/06/19 0811    BP: 122/84  Pulse: 68  Resp: 12  Temp: 98.5 F (36.9 C)  SpO2: 99%   Body mass  index is 35.71 kg/m.   Wt Readings from Last 3 Encounters:  02/06/19 228 lb (103.4 kg)  03/03/18 253 lb 6.4 oz (114.9 kg)  02/03/18 254 lb 4 oz (115.3 kg)    Physical Exam  Nursing note and vitals reviewed. Constitutional: She is oriented to person, place, and time. She appears well-developed. No distress.  HENT:  Head: Normocephalic and atraumatic.  Right Ear: Hearing, tympanic membrane, external ear and ear canal normal.  Left Ear: Hearing, tympanic membrane, external ear and ear canal normal.  Mouth/Throat: Uvula is midline, oropharynx is clear and moist and mucous membranes are normal.  Eyes: Pupils are equal, round, and reactive to light. Conjunctivae and EOM are normal.  Neck: No tracheal deviation present. No thyromegaly present.  Cardiovascular: Normal rate and regular rhythm.  No murmur heard. Pulses:      Dorsalis pedis pulses are 2+ on the right side, and 2+ on the left side.  Respiratory: Effort normal and breath sounds normal. No respiratory distress.  GI: Soft. She exhibits no mass. There is no hepatomegaly. There is no tenderness.  Genitourinary:Comments: Deferred to gyn.  Musculoskeletal: She exhibits no edema.  No major deformity or signs of synovitis appreciated.  Lymphadenopathy:    She has no cervical adenopathy.       Right: No supraclavicular adenopathy present.       Left: No supraclavicular adenopathy present.  Neurological: She is alert and oriented to person, place, and time. She has normal strength. No cranial nerve deficit. Coordination and gait normal.  Reflex Scores:      Bicep reflexes are 2+ on the right side and 2+ on the left side.      Patellar reflexes are 2+ on the right side and 2+ on the left side. Skin: Skin is warm. No rash noted. No erythema.  Psychiatric: She has a normal mood and affect. Cognitive function grossly intact. Well groomed, good eye  contact.    ASSESSMENT AND PLAN:  Ms. MARVALENE BARRETT was here today annual physical examination.   Orders Placed This Encounter  Procedures  . Comprehensive metabolic panel  . Lipid panel    Lab Results  Component Value Date   CHOL 201 (H) 02/06/2019   HDL 42.20 02/06/2019   LDLCALC 136 (H) 02/06/2019   TRIG 115.0 02/06/2019   CHOLHDL 5 02/06/2019   Lab Results  Component Value Date   CREATININE 0.78 02/06/2019   BUN 12 02/06/2019   NA 138 02/06/2019   K 3.9 02/06/2019   CL 99 02/06/2019   CO2 31 02/06/2019   Lab Results  Component Value Date   ALT 26 02/06/2019   AST 19 02/06/2019   ALKPHOS 154 (H) 02/06/2019   BILITOT 0.6 02/06/2019     Routine general medical examination at a health care facility We discussed the importance of regular physical activity and healthy diet for prevention of chronic illness and/or complications. Preventive guidelines reviewed. Vaccination up to date. She will continue following with gynecologist for her female preventive care.  Ca++ and vit D supplementation recommended. Next CPE in a year.  Screening for endocrine, metabolic and immunity disorder -     Comprehensive metabolic panel  Hypertension, essential BP adequately controlled. No changes in current management. She would like to continue following annually instead every 6 months. Recommend monitoring BP at home periodically. Eye exam is current. Continue low-salt diet.  Hyperlipemia She has not been compliant with medication, recommend trying to take Crestor 10 mg daily. Further recommendation  will be given according to lipid panel results.    Return in 1 year (on 02/06/2020) for CPE and F/U.     G. Martinique, MD  [facility]. [place] office.

## 2019-02-25 ENCOUNTER — Telehealth: Payer: Self-pay | Admitting: *Deleted

## 2019-02-25 ENCOUNTER — Other Ambulatory Visit: Payer: Self-pay | Admitting: Family Medicine

## 2019-02-25 DIAGNOSIS — Z1231 Encounter for screening mammogram for malignant neoplasm of breast: Secondary | ICD-10-CM

## 2019-02-25 NOTE — Telephone Encounter (Signed)
Results placed in bin to mail to address on file.

## 2019-02-25 NOTE — Telephone Encounter (Signed)
Copied from [place] [id number]. Topic: General - Other >> Feb 25, 2019 10:03 AM [name] wrote: Reason for CRM: pt called and left a voicemail that stated that she would like a call back regarding labs that were to be mailed out to her. Pt states that lab results were not mailed out. Please advise

## 2019-03-05 ENCOUNTER — Other Ambulatory Visit: Payer: Self-pay | Admitting: Family Medicine

## 2019-04-28 ENCOUNTER — Ambulatory Visit: Payer: BLUE CROSS/BLUE SHIELD

## 2019-06-01 ENCOUNTER — Other Ambulatory Visit: Payer: Self-pay

## 2019-06-01 DIAGNOSIS — K625 Hemorrhage of anus and rectum: Secondary | ICD-10-CM | POA: Diagnosis not present

## 2019-06-01 DIAGNOSIS — K529 Noninfective gastroenteritis and colitis, unspecified: Secondary | ICD-10-CM | POA: Diagnosis not present

## 2019-06-02 ENCOUNTER — Ambulatory Visit (INDEPENDENT_AMBULATORY_CARE_PROVIDER_SITE_OTHER): Payer: BC Managed Care – PPO | Admitting: Obstetrics & Gynecology

## 2019-06-02 ENCOUNTER — Encounter: Payer: Self-pay | Admitting: Obstetrics & Gynecology

## 2019-06-02 VITALS — BP 124/86 | Ht 67.5 in | Wt 239.0 lb

## 2019-06-02 DIAGNOSIS — N898 Other specified noninflammatory disorders of vagina: Secondary | ICD-10-CM | POA: Diagnosis not present

## 2019-06-02 DIAGNOSIS — Z01419 Encounter for gynecological examination (general) (routine) without abnormal findings: Secondary | ICD-10-CM

## 2019-06-02 DIAGNOSIS — E6609 Other obesity due to excess calories: Secondary | ICD-10-CM

## 2019-06-02 DIAGNOSIS — Z30431 Encounter for routine checking of intrauterine contraceptive device: Secondary | ICD-10-CM

## 2019-06-02 DIAGNOSIS — Z6836 Body mass index (BMI) 36.0-36.9, adult: Secondary | ICD-10-CM

## 2019-06-02 MED ORDER — TINIDAZOLE 500 MG PO TABS: 2.0000 g | ORAL_TABLET | Freq: Every day | ORAL | 0 refills | Status: AC

## 2019-06-02 NOTE — Progress Notes (Signed)
Amy Coffey [birth year] [id number]   History:    58 y.o. [id number] Married  RP:  Established patient presenting for annual gyn exam   HPI: Well on Mirena IUD x 08/2017.  No breakthrough bleeding.  No pelvic pain.  Increased vaginal discharge with odor for a few days.  No vaginal itching.  Urine and bowel movements normal.  Breasts normal.  Body mass index 36.88.  Not exercising regularly.  Health labs with family physician.  Past medical history,surgical history, family history and social history were all reviewed and documented in the EPIC chart.  Gynecologic History No LMP recorded. (Menstrual status: IUD). Contraception: Mirena IUD x 08/2017 Last Pap: 01/2016. Results were: Negative Last mammogram: 04/2018. Results were: Negative.  Scheduled for mammo in 06/2019. Bone Density: Never Colonoscopy: 3 yrs ago  Obstetric History OB History  Gravida Para Term Preterm AB Living  5 1     4 1   SAB TAB Ectopic Multiple Live Births      4        # Outcome Date GA Lbr Len/2nd Weight Sex Delivery Anes PTL Lv  5 Ectopic           4 Ectopic           3 Ectopic           2 Ectopic           1 Para              ROS: A ROS was performed and pertinent positives and negatives are included in the history.  GENERAL: No fevers or chills. HEENT: No change in vision, no earache, sore throat or sinus congestion. NECK: No pain or stiffness. CARDIOVASCULAR: No chest pain or pressure. No palpitations. PULMONARY: No shortness of breath, cough or wheeze. GASTROINTESTINAL: No abdominal pain, nausea, vomiting or diarrhea, melena or bright red blood per rectum. GENITOURINARY: No urinary frequency, urgency, hesitancy or dysuria. MUSCULOSKELETAL: No joint or muscle pain, no back pain, no recent trauma. DERMATOLOGIC: No rash, no itching, no lesions. ENDOCRINE: No polyuria, polydipsia, no heat or cold intolerance. No recent change in weight. HEMATOLOGICAL: No anemia or easy bruising or bleeding. NEUROLOGIC: No  headache, seizures, numbness, tingling or weakness. PSYCHIATRIC: No depression, no loss of interest in normal activity or change in sleep pattern.     Exam:   BP 124/86   Ht 5' 7.5" (1.715 m)   Wt 239 lb (108.4 kg)   BMI 36.88 kg/m   Body mass index is 36.88 kg/m.  General appearance : Well developed well nourished female. No acute distress HEENT: Eyes: no retinal hemorrhage or exudates,  Neck supple, trachea midline, no carotid bruits, no thyroidmegaly Lungs: Clear to auscultation, no rhonchi or wheezes, or rib retractions  Heart: Regular rate and rhythm, no murmurs or gallops Breast:Examined in sitting and supine position were symmetrical in appearance, no palpable masses or tenderness,  no skin retraction, no nipple inversion, no nipple discharge, no skin discoloration, no axillary or supraclavicular lymphadenopathy Abdomen: no palpable masses or tenderness, no rebound or guarding Extremities: no edema or skin discoloration or tenderness  Pelvic: Vulva: Normal             Vagina: No gross lesions or discharge  Cervix: No gross lesions or discharge.  IUD strings visible at the external os.  Pap reflex done.  Uterus  AV, normal size, shape and consistency, non-tender and mobile  Adnexa  Without masses or tenderness  Anus: Normal  Assessment/Plan:  58 y.o. female for annual exam   1. Encounter for routine gynecological examination with Papanicolaou smear of cervix Normal gynecologic exam except for bacterial vaginosis.  Pap reflex done.  Breast exam normal.  Scheduled for screening mammogram in July 2020.  Colonoscopy 3 years ago.  Health labs with family physician.  2. Encounter for routine checking of intrauterine contraceptive device (IUD) Well on Mirena IUD since September 2018.  Mirena IUD in good position.  3. Vaginal discharge Bacterial vaginosis clinically.  Decision to treat with tinidazole.  Usage reviewed and prescription sent to pharmacy.  Recommend using  probiotic tablets vaginally weekly for prevention.  4. Class 2 obesity due to excess calories without serious comorbidity with body mass index (BMI) of 36.0 to 36.9 in adult Recommend a lower calorie/carb diet such as [employer].  Aerobic physical activities 5 times a week and weightlifting every 2 days.  Other orders - tinidazole (TINDAMAX) 500 MG tablet; Take 4 tablets (2,000 mg total) by mouth daily for 2 days.  Princess Bruins MD, 4:39 PM 06/02/2019

## 2019-06-03 ENCOUNTER — Encounter: Payer: Self-pay | Admitting: Obstetrics & Gynecology

## 2019-06-03 DIAGNOSIS — Z01419 Encounter for gynecological examination (general) (routine) without abnormal findings: Secondary | ICD-10-CM | POA: Diagnosis not present

## 2019-06-03 NOTE — Patient Instructions (Signed)
1. Encounter for routine gynecological examination with Papanicolaou smear of cervix Normal gynecologic exam except for bacterial vaginosis.  Pap reflex done.  Breast exam normal.  Scheduled for screening mammogram in July 2020.  Colonoscopy 3 years ago.  Health labs with family physician.  2. Encounter for routine checking of intrauterine contraceptive device (IUD) Well on Mirena IUD since September 2018.  Mirena IUD in good position.  3. Vaginal discharge Bacterial vaginosis clinically.  Decision to treat with tinidazole.  Usage reviewed and prescription sent to pharmacy.  Recommend using probiotic tablets vaginally weekly for prevention.  4. Class 2 obesity due to excess calories without serious comorbidity with body mass index (BMI) of 36.0 to 36.9 in adult Recommend a lower calorie/carb diet such as [employer].  Aerobic physical activities 5 times a week and weightlifting every 2 days.  Other orders - tinidazole (TINDAMAX) 500 MG tablet; Take 4 tablets (2,000 mg total) by mouth daily for 2 days.  Amy Coffey, it was a pleasure seeing you today!  I will inform you of your results as soon as they are available.

## 2019-06-03 NOTE — Addendum Note (Signed)
Addended by: Thurnell Garbe A on: 06/03/2019 09:49 AM   Modules accepted: Orders

## 2019-06-04 LAB — PAP IG W/ RFLX HPV ASCU

## 2019-06-10 DIAGNOSIS — R197 Diarrhea, unspecified: Secondary | ICD-10-CM | POA: Diagnosis not present

## 2019-06-10 DIAGNOSIS — K523 Indeterminate colitis: Secondary | ICD-10-CM | POA: Diagnosis not present

## 2019-06-10 DIAGNOSIS — R945 Abnormal results of liver function studies: Secondary | ICD-10-CM | POA: Diagnosis not present

## 2019-06-16 DIAGNOSIS — S83241A Other tear of medial meniscus, current injury, right knee, initial encounter: Secondary | ICD-10-CM | POA: Diagnosis not present

## 2019-06-16 DIAGNOSIS — R197 Diarrhea, unspecified: Secondary | ICD-10-CM | POA: Diagnosis not present

## 2019-06-29 ENCOUNTER — Ambulatory Visit: Payer: BLUE CROSS/BLUE SHIELD

## 2019-08-06 ENCOUNTER — Other Ambulatory Visit: Payer: Self-pay

## 2019-08-06 ENCOUNTER — Ambulatory Visit
Admission: RE | Admit: 2019-08-06 | Discharge: 2019-08-06 | Disposition: A | Discharge status: Home / Self Care | Payer: BC Managed Care – PPO | Source: Ambulatory Visit | Attending: Family Medicine | Admitting: Family Medicine

## 2019-08-06 DIAGNOSIS — Z1231 Encounter for screening mammogram for malignant neoplasm of breast: Secondary | ICD-10-CM | POA: Diagnosis not present

## 2019-11-09 DIAGNOSIS — Z6835 Body mass index (BMI) 35.0-35.9, adult: Secondary | ICD-10-CM | POA: Diagnosis not present

## 2019-11-09 DIAGNOSIS — M542 Cervicalgia: Secondary | ICD-10-CM | POA: Diagnosis not present

## 2019-11-11 ENCOUNTER — Ambulatory Visit: Payer: BC Managed Care – PPO | Admitting: Family Medicine

## 2019-11-11 ENCOUNTER — Encounter: Payer: Self-pay | Admitting: Family Medicine

## 2019-11-11 ENCOUNTER — Other Ambulatory Visit: Payer: Self-pay

## 2019-11-11 VITALS — BP 128/80 | HR 98 | Temp 95.6°F | Resp 16 | Ht 67.5 in | Wt 237.0 lb

## 2019-11-11 DIAGNOSIS — H9201 Otalgia, right ear: Secondary | ICD-10-CM

## 2019-11-11 DIAGNOSIS — D509 Iron deficiency anemia, unspecified: Secondary | ICD-10-CM | POA: Diagnosis not present

## 2019-11-11 DIAGNOSIS — I10 Essential (primary) hypertension: Secondary | ICD-10-CM

## 2019-11-11 DIAGNOSIS — R0982 Postnasal drip: Secondary | ICD-10-CM

## 2019-11-11 DIAGNOSIS — D72829 Elevated white blood cell count, unspecified: Secondary | ICD-10-CM

## 2019-11-11 DIAGNOSIS — M542 Cervicalgia: Secondary | ICD-10-CM

## 2019-11-11 MED ORDER — TIZANIDINE HCL 4 MG PO TABS: 4.0000 mg | ORAL_TABLET | Freq: Three times a day (TID) | ORAL | 0 refills | Status: AC | PRN

## 2019-11-11 MED ORDER — CELECOXIB 100 MG PO CAPS: 100.0000 mg | ORAL_CAPSULE | Freq: Two times a day (BID) | ORAL | 0 refills | Status: AC

## 2019-11-11 MED ORDER — KETOROLAC TROMETHAMINE 60 MG/2ML IM SOLN
60.0000 mg | Freq: Once | INTRAMUSCULAR | Status: AC
  Administered 2019-11-11: 60 mg via INTRAMUSCULAR

## 2019-11-11 NOTE — Patient Instructions (Addendum)
A few things to remember from today's visit:   Hypertension, essential - Plan: Basic Metabolic Panel  Neck pain - Plan: ketorolac (TORADOL) injection 60 mg, tiZANidine (ZANAFLEX) 4 MG tablet, celecoxib (CELEBREX) 100 MG capsule  Iron deficiency anemia, unspecified iron deficiency anemia type - Plan: CBC, Ferritin  Neck pain seems to be related with muscle inflammation. It is going to take some time to completely resolved.  Please be sure medication list is accurate. If a new problem present, please set up appointment sooner than planned today.

## 2019-11-11 NOTE — Progress Notes (Signed)
ACUTE VISIT   HPI:  Chief Complaint  Patient presents with  . right ear pain    [name] is a 58 y.o. female, who is here today complaining of 4 days of right earache and neck pain. She was evaluated by orthopedist this past Monday and received a prescription for prednisone.  Initially pain was improving but yesterday started back. According to pt,nect imaging was done.  Pain is exacerbated with movement and swallowing. Alleviated by rest. Intermittent achy pain, 5-6/10. Pain is not radiated to upper extremity. She has not noted numbness, tingling, or weakness of upper extremities.  She is also complaining about postnasal drainage. History of allergic rhinitis, yesterday she took Zyrtec and has been using Flonase nasal spray as needed. She has not noted hearing changes or ear drainage(she thinks she had some last week).  She denies fever, chills, fatigue, body aches, changes in smell or taste, wheezing, abdominal pain, nausea, vomiting, or rash. Occasionally she has nonproductive cough. She denies sick contact.   She is also requesting labs, iron. She has seen GI since her last OV and reporting normal LFT's. She stopped iron supplementation because H/H was improved. She has not noted blood in stool,melena,or gross hematuria. Negative for pica.  Lab Results  Component Value Date   WBC 13.2 (H) 03/03/2018   HGB 11.3 (L) 03/03/2018   HCT 34.5 03/03/2018   MCV 86.9 03/03/2018   PLT 538 (H) 03/03/2018    Hx of DUB, IUD was placed.  Hypertension: Last follow-up in 01/2019. She is on Losartan-HCTZ 100-25 mg daily.  Denies severe/frequent headache, visual changes, chest pain, dyspnea, palpitation, claudication, focal weakness, or edema.  Lab Results  Component Value Date   CREATININE 0.78 02/06/2019   BUN 12 02/06/2019   NA 138 02/06/2019   K 3.9 02/06/2019   CL 99 02/06/2019   CO2 31 02/06/2019    Review of Systems  Constitutional: Negative  for activity change, appetite change, fatigue and unexpected weight change.  HENT: Negative for mouth sores, nosebleeds and trouble swallowing.   Eyes: Negative for discharge, redness and itching.  Respiratory: Negative for wheezing and stridor.   Gastrointestinal:       Negative for changes in bowel habits.  Genitourinary: Negative for decreased urine volume and dysuria.  Musculoskeletal: Negative for gait problem.  Neurological: Negative for syncope and facial asymmetry.  Psychiatric/Behavioral: The patient is nervous/anxious.   Rest see pertinent positives and negatives per HPI.   Current Outpatient Medications on File Prior to Visit  Medication Sig Dispense Refill  . FeFum-FePoly-FA-B Cmp-C-Biot (FOLIVANE-PLUS) CAPS TAKE ONE CAPSULE BY MOUTH EVERY DAY 30 capsule 2  . ferrous sulfate 325 (65 FE) MG EC tablet Take 325 mg by mouth every morning.    Marland Kitchen losartan-hydrochlorothiazide (HYZAAR) 100-25 MG tablet Take 1 tablet by mouth every morning. 90 tablet 3  . mesalamine (APRISO) 0.375 g 24 hr capsule Take 1,500 mg by mouth every morning.     Marland Kitchen Nystatin POWD Apply small amount to affected area 1 Bottle 1  . rosuvastatin (CRESTOR) 10 MG tablet TAKE 1 TABLET BY MOUTH EVERY DAY IN THE EVENING 90 tablet 2   No current facility-administered medications on file prior to visit.      Past Medical History:  Diagnosis Date  . Arthritis    knees  . Heart murmur    per pt had echo done yrs ago was told mild  . History of hyperprolactinemia    02/  2018  resolved  . Hyperlipidemia   . Hypertension   . Iron deficiency anemia   . Menometrorrhagia    REFRACTORY TO PROGESTIN  . Ulcerative colitis ([place]) followed by [facility]   per pt dx 1990s  . Wears glasses    No Known Allergies  Social History   Socioeconomic History  . Marital status: Married    Spouse name: [name]  . Number of children: 1  . Years of education: BA  . Highest education level: Not on file  Occupational History  .  Occupation: Diplomatic Services operational officer  . Financial resource strain: Not on file  . Food insecurity    Worry: Not on file    Inability: Not on file  . Transportation needs    Medical: Not on file    Non-medical: Not on file  Tobacco Use  . Smoking status: Former Smoker    Years: 10.00    Types: Cigarettes    Quit date: 12/03/1997    Years since quitting: 21.9  . Smokeless tobacco: Never Used  Substance and Sexual Activity  . Alcohol use: No  . Drug use: No  . Sexual activity: Yes    Partners: Male    Birth control/protection: I.U.D.    Comment: 1st intercourse- 81, partners- 58, married- 103 yrs  Lifestyle  . Physical activity    Days per week: Not on file    Minutes per session: Not on file  . Stress: Not on file  Relationships  . Social Herbalist on phone: Not on file    Gets together: Not on file    Attends religious service: Not on file    Active member of club or organization: Not on file    Attends meetings of clubs or organizations: Not on file    Relationship status: Not on file  Other Topics Concern  . Not on file  Social History Narrative   Lives with husband   Caffeine use: coffee daily   [employer] on weekends    Vitals:   11/11/19 1434  BP: 128/80  Pulse: 98  Resp: 16  Temp: (!) 95.6 F (35.3 C)  SpO2: 99%   Body mass index is 36.57 kg/m.  Physical Exam  Nursing note and vitals reviewed. Constitutional: She is oriented to person, place, and time. She appears well-developed. No distress.  HENT:  Head: Normocephalic and atraumatic.  Right Ear: Tympanic membrane, external ear and ear canal normal.  Left Ear: Tympanic membrane, external ear and ear canal normal.  Nose: Septal deviation present. Right sinus exhibits no maxillary sinus tenderness and no frontal sinus tenderness. Left sinus exhibits no maxillary sinus tenderness and no frontal sinus tenderness.  Mouth/Throat: Oropharynx is clear and moist and mucous membranes are normal.  Right  hypertrophic turbinate and post nasal drainage.  Eyes: Pupils are equal, round, and reactive to light. Conjunctivae are normal.  Cardiovascular: Normal rate and regular rhythm.  No murmur heard. Pulses:      Dorsalis pedis pulses are 2+ on the right side and 2+ on the left side.  Respiratory: Effort normal and breath sounds normal. No respiratory distress.  GI: Soft. She exhibits no mass. There is no hepatomegaly. There is no abdominal tenderness.  Musculoskeletal:        General: No edema.     Cervical back: She exhibits decreased range of motion, tenderness and spasm.       Back:     Comments: Also pain elicited with  palpation of right sternocleidomastoid muscle.  Lymphadenopathy:    She has no cervical adenopathy.  Neurological: She is alert and oriented to person, place, and time. She has normal strength. No cranial nerve deficit. Gait normal.  Skin: Skin is warm. No rash noted. No erythema.  Psychiatric: Her mood appears anxious.  Well groomed, good eye contact.   ASSESSMENT AND PLAN:  Ms. Margareth was seen today for right ear pain.  Diagnoses and all orders for this visit: Orders Placed This Encounter  Procedures  . Basic Metabolic Panel  . CBC  . Ferritin   Lab Results  Component Value Date   WBC 27.0 Repeated and verified X2. (HH) 11/11/2019   HGB 12.4 11/11/2019   HCT 38.7 11/11/2019   MCV 86.6 11/11/2019   PLT 661.0 (H) 11/11/2019   Lab Results  Component Value Date   CREATININE 0.94 11/11/2019   BUN 19 11/11/2019   NA 136 11/11/2019   K 4.0 11/11/2019   CL 97 11/11/2019   CO2 28 11/11/2019    Earache on right Ear exam otherwise normal.  It seems to be referred pain. Continue monitoring for changes.  Hypertension, essential BP adequately controlled. Recommend monitoring BP regularly. Continue low salt diet. No changes in current management.  -     Basic Metabolic Panel  Neck pain Here in the office and after verbal consent she received Toradol  60 mg IM x 1. Continue with Celebrex x 7-10 days. Side effects of NSAID's discussed. Zanaflex 4 mg at bedtime x 10-14 days then prn.  -     ketorolac (TORADOL) injection 60 mg -     tiZANidine (ZANAFLEX) 4 MG tablet; Take 1 tablet (4 mg total) by mouth every 8 (eight) hours as needed for up to 21 days for muscle spasms. -     celecoxib (CELEBREX) 100 MG capsule; Take 1 capsule (100 mg total) by mouth 2 (two) times daily for 10 days.  Iron deficiency anemia, unspecified iron deficiency anemia type Further recommendations will be given according to CBC and ferritin results. Colonoscopy in 2017.  Postnasal drip Possible etiologies discussed. Probably related to allergic rhinitis. Recommend continuing daily Cetirizine 10 mg and Flonase nasal spray. Nasal saline irrigations with saline as needed.  Marked leukocytosis seen in cbc. We will repeat lab work tomorrow and make recommendations accordingly. If still elevated we will need to have hematologist evaluation.  Return if symptoms worsen or fail to improve, for Keep next appt.   Greydon Betke G. Martinique, MD  [facility]. [place] office.

## 2019-11-12 ENCOUNTER — Telehealth: Payer: Self-pay | Admitting: Family Medicine

## 2019-11-12 LAB — BASIC METABOLIC PANEL
BUN: 19 mg/dL (ref 6–23)
CO2: 28 mEq/L (ref 19–32)
Calcium: 9.9 mg/dL (ref 8.4–10.5)
Chloride: 97 mEq/L (ref 96–112)
Creatinine, Ser: 0.94 mg/dL (ref 0.40–1.20)
GFR: 74.34 mL/min (ref >60.00)
Glucose, Bld: 97 mg/dL (ref 70–99)
Potassium: 4 mEq/L (ref 3.5–5.1)
Sodium: 136 mEq/L (ref 135–145)

## 2019-11-12 LAB — CBC
HCT: 38.7 % (ref 36.0–46.0)
Hemoglobin: 12.4 g/dL (ref 12.0–15.0)
MCHC: 32 g/dL (ref 30.0–36.0)
MCV: 86.6 fl (ref 78.0–100.0)
Platelets: 661 10*3/uL — ABNORMAL HIGH (ref 150.0–400.0)
RBC: 4.48 Mil/uL (ref 3.87–5.11)
RDW: 15.8 % — ABNORMAL HIGH (ref 11.5–15.5)
WBC: 27 10*3/uL (ref 4.0–10.5)

## 2019-11-12 LAB — FERRITIN: Ferritin: 196.8 ng/mL (ref 10.0–291.0)

## 2019-11-12 NOTE — Telephone Encounter (Signed)
Provider covering for Dr. Martinique, who is out of the office today.  Per Dr. Doug Sou note from yesterday, pt started on Prednisone this wk by Ortho.  Pt also with suspected referred ear pain as ear exam was normal.  WBC elevation likely 2/2 prednisone.  Would have pt f/u with Dr. Martinique tomorrow.  Repeat WBC in a several days to ensure not increasing.  For increased or worsened symptoms or fever, chills, n/v, etc., pt should be evaluated in person (UC, ED, etc)

## 2019-11-12 NOTE — Telephone Encounter (Signed)
CBC - White Count at 27

## 2019-11-12 NOTE — Telephone Encounter (Signed)
Per Dr. Volanda Napoleon instructions, this pt has been scheduled for an in office visit tomorrow at 11 AM for elevated WBC.  FYI.

## 2019-11-12 NOTE — Telephone Encounter (Signed)
Marked elevated blood cells, we need to repeat labs. I placed future labs to be done tomorrow. Thanks, BJ

## 2019-11-12 NOTE — Telephone Encounter (Signed)
Dr. Volanda Napoleon, can you look at this in the absence of Dr. Martinique?

## 2019-11-13 ENCOUNTER — Encounter: Payer: Self-pay | Admitting: Family Medicine

## 2019-11-13 ENCOUNTER — Ambulatory Visit: Payer: BC Managed Care – PPO | Admitting: Family Medicine

## 2019-11-13 ENCOUNTER — Ambulatory Visit (INDEPENDENT_AMBULATORY_CARE_PROVIDER_SITE_OTHER): Payer: BC Managed Care – PPO

## 2019-11-13 ENCOUNTER — Other Ambulatory Visit: Payer: Self-pay

## 2019-11-13 ENCOUNTER — Telehealth: Payer: BC Managed Care – PPO | Admitting: Family Medicine

## 2019-11-13 ENCOUNTER — Telehealth: Payer: Self-pay

## 2019-11-13 VITALS — BP 126/80 | HR 95 | Resp 12 | Ht 67.5 in | Wt 237.0 lb

## 2019-11-13 DIAGNOSIS — M542 Cervicalgia: Secondary | ICD-10-CM | POA: Diagnosis not present

## 2019-11-13 DIAGNOSIS — D72829 Elevated white blood cell count, unspecified: Secondary | ICD-10-CM

## 2019-11-13 LAB — CBC WITH DIFFERENTIAL/PLATELET
Basophils Absolute: 0.2 10*3/uL — ABNORMAL HIGH (ref 0.0–0.1)
Basophils Relative: 1 % (ref 0.0–3.0)
Eosinophils Absolute: 0.1 10*3/uL (ref 0.0–0.7)
Eosinophils Relative: 0.5 % (ref 0.0–5.0)
HCT: 39.6 % (ref 36.0–46.0)
Hemoglobin: 12.5 g/dL (ref 12.0–15.0)
Lymphocytes Relative: 31.5 % (ref 12.0–46.0)
Lymphs Abs: 6.2 10*3/uL — ABNORMAL HIGH (ref 0.7–4.0)
MCHC: 31.6 g/dL (ref 30.0–36.0)
MCV: 86 fl (ref 78.0–100.0)
Monocytes Absolute: 1.2 10*3/uL — ABNORMAL HIGH (ref 0.1–1.0)
Monocytes Relative: 6.1 % (ref 3.0–12.0)
Neutro Abs: 12.1 10*3/uL — ABNORMAL HIGH (ref 1.4–7.7)
Neutrophils Relative %: 60.9 % (ref 43.0–77.0)
Platelets: 651 10*3/uL — ABNORMAL HIGH (ref 150.0–400.0)
RBC: 4.61 Mil/uL (ref 3.87–5.11)
RDW: 15.8 % — ABNORMAL HIGH (ref 11.5–15.5)
WBC: 19.8 10*3/uL (ref 4.0–10.5)

## 2019-11-13 LAB — HEPATIC FUNCTION PANEL
ALT: 16 U/L (ref 0–35)
AST: 14 U/L (ref 0–37)
Albumin: 4.5 g/dL (ref 3.5–5.2)
Alkaline Phosphatase: 148 U/L — ABNORMAL HIGH (ref 39–117)
Bilirubin, Direct: 0.1 mg/dL (ref 0.0–0.3)
Total Bilirubin: 0.6 mg/dL (ref 0.2–1.2)
Total Protein: 8 g/dL (ref 6.0–8.3)

## 2019-11-13 LAB — SEDIMENTATION RATE: Sed Rate: 76 mm/hr — ABNORMAL HIGH (ref 0–30)

## 2019-11-13 LAB — C-REACTIVE PROTEIN: CRP: 1 mg/dL (ref 0.5–20.0)

## 2019-11-13 NOTE — Telephone Encounter (Signed)
CRITICAL VALUE STICKER  CRITICAL VALUE: WBC 19.8  RECEIVER (on-site recipient of call): Judson Roch, CMA  DATE & TIME NOTIFIED: 11/13/2019 @2 :50pm.  MESSENGER (representative from lab): Santiago Glad  MD NOTIFIED: Yes  TIME OF NOTIFICATION: 11/13/2019 @2 :53pm.  RESPONSE: via inbasket.

## 2019-11-13 NOTE — Patient Instructions (Signed)
A few things to remember from today's visit:   Leukocytosis, unspecified type - Plan: DG Chest 2 View  Lab Results  Component Value Date   WBC 27.0 Repeated and verified X2. (HH) 11/11/2019   HGB 12.4 11/11/2019   HCT 38.7 11/11/2019   MCV 86.6 11/11/2019   PLT 661.0 (H) 11/11/2019    Today we are repeating blood test, if elevated numbers are persistent, appointment with hematology will be arranged.  Please be sure medication list is accurate. If a new problem present, please set up appointment sooner than planned today.

## 2019-11-13 NOTE — Telephone Encounter (Signed)
Pt has an appointment today at 11am.

## 2019-11-13 NOTE — Progress Notes (Signed)
ACUTE VISIT   HPI:  Chief Complaint  Patient presents with  . Follow-up    Amy Coffey is a 58 y.o. female, who is here today to discuss blood work done a couple days ago. She was last seen on 11/11/19 because right earache and neck pain. She is reporting improvement of pain. She is taking Zanaflex 4 mg and Celebrex. Tolerating medication well.  CBC was ordered to follow on anemia, WBC marked elevated. She has had mildly elevated WBC's before.   Lab Results  Component Value Date   WBC 27.0 Repeated and verified X2. (HH) 11/11/2019   HGB 12.4 11/11/2019   HCT 38.7 11/11/2019   MCV 86.6 11/11/2019   PLT 661.0 (H) 11/11/2019    She has seen hematologist years ago. She took Prednisone, d/c yesterday.   Negative for fever,chills night sweats,dysphagia,cough,wheezing,abdominal pain,N/V,changes in bowel habits, or urinary symptoms. Report all recommended cancer screening test for her age as current.   Review of Systems  Constitutional: Negative for activity change, appetite change, fatigue and unexpected weight change.  HENT: Negative for mouth sores.   Genitourinary: Negative for dysuria and hematuria.  Musculoskeletal: Negative for gait problem and myalgias.  Skin: Negative for pallor, rash and wound.  Neurological: Negative for syncope, weakness and headaches.  Hematological: Negative for adenopathy. Does not bruise/bleed easily.  Rest see pertinent positives and negatives per HPI.   Current Outpatient Medications on File Prior to Visit  Medication Sig Dispense Refill  . celecoxib (CELEBREX) 100 MG capsule Take 1 capsule (100 mg total) by mouth 2 (two) times daily for 10 days. 20 capsule 0  . FeFum-FePoly-FA-B Cmp-C-Biot (FOLIVANE-PLUS) CAPS TAKE ONE CAPSULE BY MOUTH EVERY DAY 30 capsule 2  . ferrous sulfate 325 (65 FE) MG EC tablet Take 325 mg by mouth every morning.    Marland Kitchen losartan-hydrochlorothiazide (HYZAAR) 100-25 MG tablet Take 1 tablet by mouth  every morning. 90 tablet 3  . mesalamine (APRISO) 0.375 g 24 hr capsule Take 1,500 mg by mouth every morning.     Marland Kitchen Nystatin POWD Apply small amount to affected area 1 Bottle 1  . rosuvastatin (CRESTOR) 10 MG tablet TAKE 1 TABLET BY MOUTH EVERY DAY IN THE EVENING 90 tablet 2  . tiZANidine (ZANAFLEX) 4 MG tablet Take 1 tablet (4 mg total) by mouth every 8 (eight) hours as needed for up to 21 days for muscle spasms. 30 tablet 0   No current facility-administered medications on file prior to visit.     Past Medical History:  Diagnosis Date  . Arthritis    knees  . Heart murmur    per pt had echo done yrs ago was told mild  . History of hyperprolactinemia    02/ 2018  resolved  . Hyperlipidemia   . Hypertension   . Iron deficiency anemia   . Menometrorrhagia    REFRACTORY TO PROGESTIN  . Ulcerative colitis ([place]) followed by [facility]   per pt dx 1990s  . Wears glasses    No Known Allergies  Social History   Socioeconomic History  . Marital status: Married    Spouse name: [name]  . Number of children: 1  . Years of education: BA  . Highest education level: Not on file  Occupational History  . Occupation: Airline pilot  Tobacco Use  . Smoking status: Former Smoker    Years: 10.00    Types: Cigarettes    Quit date: 12/03/1997    Years since quitting:  21.9  . Smokeless tobacco: Never Used  Substance and Sexual Activity  . Alcohol use: No  . Drug use: No  . Sexual activity: Yes    Partners: Male    Birth control/protection: I.U.D.    Comment: 1st intercourse- 55, partners- 48, married- 87 yrs  Other Topics Concern  . Not on file  Social History Narrative   Lives with husband   Caffeine use: coffee daily   Pepsi on weekends   Social Determinants of Health   Financial Resource Strain:   . Difficulty of Paying Living Expenses: Not on file  Food Insecurity:   . Worried About Charity fundraiser in the Last Year: Not on file  . Ran Out of Food in the Last Year: Not on file    Transportation Needs:   . Lack of Transportation (Medical): Not on file  . Lack of Transportation (Non-Medical): Not on file  Physical Activity:   . Days of Exercise per Week: Not on file  . Minutes of Exercise per Session: Not on file  Stress:   . Feeling of Stress : Not on file  Social Connections:   . Frequency of Communication with Friends and Family: Not on file  . Frequency of Social Gatherings with Friends and Family: Not on file  . Attends Religious Services: Not on file  . Active Member of Clubs or Organizations: Not on file  . Attends Archivist Meetings: Not on file  . Marital Status: Not on file    Vitals:   11/13/19 1105  BP: 126/80  Pulse: 95  Resp: 12  SpO2: 97%   Body mass index is 36.57 kg/m.   Physical Exam  Nursing note and vitals reviewed. Constitutional: She is oriented to person, place, and time. She appears well-developed. No distress.  HENT:  Head: Normocephalic and atraumatic.  Mouth/Throat: Oropharynx is clear and moist and mucous membranes are normal.  Eyes: Conjunctivae are normal.  Cardiovascular: Normal rate and regular rhythm.  Respiratory: Effort normal and breath sounds normal. No respiratory distress.  GI: Soft. She exhibits no mass. There is no hepatosplenomegaly. There is no abdominal tenderness.  Musculoskeletal:        General: No edema.  Lymphadenopathy:       Head (right side): No submandibular adenopathy present.       Head (left side): No submandibular adenopathy present.    She has no cervical adenopathy.       Right: No supraclavicular adenopathy present.       Left: No supraclavicular adenopathy present.  Neurological: She is alert and oriented to person, place, and time. She has normal strength. No cranial nerve deficit. Gait normal.  Skin: Skin is warm. No rash noted. No erythema.  Psychiatric: She has a normal mood and affect.  Well groomed, good eye contact.    ASSESSMENT AND PLAN:  Amy Coffey was seen  today for follow-up.  Diagnoses and all orders for this visit:  -     DG Chest 2 View; Future -     Sed Rate (ESR) -     Hepatic function panel -     C-reactive protein -     CBC with Differential/Platelet -     DG Chest 2 View   Lab Results  Component Value Date   WBC 19.8 Repeated and verified X2. (HH) 11/13/2019   HGB 12.5 11/13/2019   HCT 39.6 11/13/2019   MCV 86.0 11/13/2019   PLT 651.0 (H) 11/13/2019  Lab Results  Component Value Date   ESRSEDRATE 76 (H) 11/13/2019   Lab Results  Component Value Date   CRP 1.0 11/13/2019   Lab Results  Component Value Date   ALT 16 11/13/2019   AST 14 11/13/2019   ALKPHOS 148 (H) 11/13/2019   BILITOT 0.6 11/13/2019    Leukocytosis, unspecified type We discussed possible etiologies,including the possibility of more serious process . Hx does not suggest an infectious process. ? Myeloproliferative disorder. Further recommendations will be given according to lab results. Hematology referral if numbers are not improved.  Neck pain Improved. No changes in current management.   Return if symptoms worsen or fail to improve.     Rilyn Scroggs G. Martinique, MD  [facility]. [place] office.

## 2019-11-17 ENCOUNTER — Telehealth: Payer: Self-pay | Admitting: Hematology and Oncology

## 2019-11-17 NOTE — Telephone Encounter (Signed)
I left a message regarding schedule  

## 2019-11-19 ENCOUNTER — Ambulatory Visit: Payer: BC Managed Care – PPO | Admitting: Physical Therapy

## 2019-11-19 ENCOUNTER — Telehealth: Payer: BC Managed Care – PPO | Admitting: Family Medicine

## 2019-11-19 DIAGNOSIS — N898 Other specified noninflammatory disorders of vagina: Secondary | ICD-10-CM | POA: Diagnosis not present

## 2019-11-19 DIAGNOSIS — Z20828 Contact with and (suspected) exposure to other viral communicable diseases: Secondary | ICD-10-CM | POA: Diagnosis not present

## 2019-11-19 DIAGNOSIS — R109 Unspecified abdominal pain: Secondary | ICD-10-CM | POA: Diagnosis not present

## 2019-11-19 DIAGNOSIS — J019 Acute sinusitis, unspecified: Secondary | ICD-10-CM | POA: Diagnosis not present

## 2019-11-19 NOTE — Addendum Note (Signed)
Addended by: Suzette Battiest on: 11/19/2019 03:34 PM   Modules accepted: Orders

## 2019-11-20 ENCOUNTER — Other Ambulatory Visit: Payer: BC Managed Care – PPO

## 2019-11-20 ENCOUNTER — Other Ambulatory Visit: Payer: Self-pay

## 2019-11-20 DIAGNOSIS — D72829 Elevated white blood cell count, unspecified: Secondary | ICD-10-CM

## 2019-11-21 LAB — CBC WITH DIFFERENTIAL/PLATELET
Absolute Monocytes: 935 cells/uL (ref 200–950)
Basophils Absolute: 164 cells/uL (ref 0–200)
Basophils Relative: 1 %
Eosinophils Absolute: 82 cells/uL (ref 15–500)
Eosinophils Relative: 0.5 %
HCT: 37 % (ref 35.0–45.0)
Hemoglobin: 11.9 g/dL (ref 11.7–15.5)
Lymphs Abs: 4625 cells/uL — ABNORMAL HIGH (ref 850–3900)
MCH: 27.4 pg (ref 27.0–33.0)
MCHC: 32.2 g/dL (ref 32.0–36.0)
MCV: 85.3 fL (ref 80.0–100.0)
MPV: 9.7 fL (ref 7.5–12.5)
Monocytes Relative: 5.7 %
Neutro Abs: 10594 cells/uL — ABNORMAL HIGH (ref 1500–7800)
Neutrophils Relative %: 64.6 %
Platelets: 509 10*3/uL — ABNORMAL HIGH (ref 140–400)
RBC: 4.34 10*6/uL (ref 3.80–5.10)
RDW: 13.6 % (ref 11.0–15.0)
Total Lymphocyte: 28.2 %
WBC: 16.4 10*3/uL — ABNORMAL HIGH (ref 3.8–10.8)

## 2019-11-21 LAB — SEDIMENTATION RATE: Sed Rate: 48 mm/h — ABNORMAL HIGH (ref 0–30)

## 2019-11-22 ENCOUNTER — Other Ambulatory Visit: Payer: Self-pay | Admitting: Family Medicine

## 2019-11-23 ENCOUNTER — Telehealth: Payer: Self-pay

## 2019-11-23 ENCOUNTER — Encounter: Payer: Self-pay | Admitting: Family Medicine

## 2019-11-23 NOTE — Telephone Encounter (Signed)
Copied from [place] [id number]. Topic: General - Other >> Nov 23, 2019  3:57 PM Yvette Rack wrote: Reason for CRM: Pt called for lab results. Pt requests call back.

## 2019-11-23 NOTE — Telephone Encounter (Signed)
I called and spoke with pt. We went over the results & she was able to reset her mychart password & get back into her account.

## 2019-11-25 ENCOUNTER — Telehealth: Payer: Self-pay | Admitting: *Deleted

## 2019-11-25 ENCOUNTER — Other Ambulatory Visit: Payer: Self-pay | Admitting: Family Medicine

## 2019-11-25 DIAGNOSIS — D72829 Elevated white blood cell count, unspecified: Secondary | ICD-10-CM

## 2019-11-25 NOTE — Telephone Encounter (Signed)
I spoke with pt. She thinks her prednisone is why her levels were elevated. She had gotten the number of tablets mixed up and took more than she was suppose too. She is wanting to know if we can repeat the cbc next week just to see where the level is at now?

## 2019-11-25 NOTE — Telephone Encounter (Signed)
Okay to repeat the cbc & sed rate?

## 2019-11-25 NOTE — Telephone Encounter (Signed)
Copied from [place] [phone]. Topic: Quick Communication - See Telephone Encounter >> Nov 25, 2019 12:14 PM Loma [place] wrote: CRM for notification. See Telephone encounter for: 11/25/19. Pt called and said she had moved her appt with the hematologist to Jan 12. She wants to know if all her labs can be reordered so she will not have to go to the appt. Pls FU with pt at [phone]

## 2019-11-25 NOTE — Telephone Encounter (Signed)
I do not think we need to continue weekly labs at this time. She has appt with oncologist on 12/21/19. Thanks, BJ

## 2019-11-25 NOTE — Telephone Encounter (Signed)
CBC order placed. Thanks, BJ

## 2019-11-30 ENCOUNTER — Inpatient Hospital Stay: Payer: BC Managed Care – PPO | Admitting: Hematology and Oncology

## 2019-11-30 NOTE — Telephone Encounter (Signed)
I called and spoke with pt. She is scheduled for tomorrow at 4pm.

## 2019-12-01 ENCOUNTER — Other Ambulatory Visit: Payer: Self-pay

## 2019-12-01 ENCOUNTER — Other Ambulatory Visit (INDEPENDENT_AMBULATORY_CARE_PROVIDER_SITE_OTHER): Payer: BC Managed Care – PPO

## 2019-12-01 DIAGNOSIS — D72829 Elevated white blood cell count, unspecified: Secondary | ICD-10-CM | POA: Diagnosis not present

## 2019-12-02 LAB — CBC WITH DIFFERENTIAL/PLATELET
Basophils Absolute: 0.2 10*3/uL — ABNORMAL HIGH (ref 0.0–0.1)
Basophils Relative: 1.2 % (ref 0.0–3.0)
Eosinophils Absolute: 0.2 10*3/uL (ref 0.0–0.7)
Eosinophils Relative: 1.7 % (ref 0.0–5.0)
HCT: 33.9 % — ABNORMAL LOW (ref 36.0–46.0)
Hemoglobin: 11.1 g/dL — ABNORMAL LOW (ref 12.0–15.0)
Lymphocytes Relative: 29.2 % (ref 12.0–46.0)
Lymphs Abs: 4 10*3/uL (ref 0.7–4.0)
MCHC: 32.6 g/dL (ref 30.0–36.0)
MCV: 85.8 fl (ref 78.0–100.0)
Monocytes Absolute: 0.7 10*3/uL (ref 0.1–1.0)
Monocytes Relative: 5.4 % (ref 3.0–12.0)
Neutro Abs: 8.6 10*3/uL — ABNORMAL HIGH (ref 1.4–7.7)
Neutrophils Relative %: 62.5 % (ref 43.0–77.0)
Platelets: 423 10*3/uL — ABNORMAL HIGH (ref 150.0–400.0)
RBC: 3.95 Mil/uL (ref 3.87–5.11)
RDW: 15.5 % (ref 11.5–15.5)
WBC: 13.8 10*3/uL — ABNORMAL HIGH (ref 4.0–10.5)

## 2019-12-17 ENCOUNTER — Telehealth: Payer: Self-pay | Admitting: Hematology and Oncology

## 2019-12-17 NOTE — Telephone Encounter (Signed)
Returned patient's phone call regarding cancelling 01/18 appointment, per patient's request appointment has been cancelled.

## 2019-12-21 ENCOUNTER — Inpatient Hospital Stay: Payer: BC Managed Care – PPO | Admitting: Hematology and Oncology

## 2020-02-22 ENCOUNTER — Encounter: Payer: BC Managed Care – PPO | Admitting: Family Medicine

## 2020-02-23 ENCOUNTER — Other Ambulatory Visit: Payer: Self-pay

## 2020-02-24 ENCOUNTER — Encounter: Payer: Self-pay | Admitting: Family Medicine

## 2020-02-24 ENCOUNTER — Ambulatory Visit (INDEPENDENT_AMBULATORY_CARE_PROVIDER_SITE_OTHER): Payer: BC Managed Care – PPO | Admitting: Family Medicine

## 2020-02-24 ENCOUNTER — Other Ambulatory Visit (INDEPENDENT_AMBULATORY_CARE_PROVIDER_SITE_OTHER): Payer: BC Managed Care – PPO

## 2020-02-24 VITALS — BP 126/70 | HR 81 | Temp 95.3°F | Resp 12 | Ht 67.5 in | Wt 237.0 lb

## 2020-02-24 DIAGNOSIS — D509 Iron deficiency anemia, unspecified: Secondary | ICD-10-CM | POA: Diagnosis not present

## 2020-02-24 DIAGNOSIS — E785 Hyperlipidemia, unspecified: Secondary | ICD-10-CM

## 2020-02-24 DIAGNOSIS — Z13228 Encounter for screening for other metabolic disorders: Secondary | ICD-10-CM

## 2020-02-24 DIAGNOSIS — Z130 Encounter for screening for diseases of the blood and blood-forming organs and certain disorders involving the immune mechanism: Secondary | ICD-10-CM

## 2020-02-24 DIAGNOSIS — D72829 Elevated white blood cell count, unspecified: Secondary | ICD-10-CM

## 2020-02-24 DIAGNOSIS — R700 Elevated erythrocyte sedimentation rate: Secondary | ICD-10-CM

## 2020-02-24 DIAGNOSIS — I10 Essential (primary) hypertension: Secondary | ICD-10-CM

## 2020-02-24 DIAGNOSIS — Z0000 Encounter for general adult medical examination without abnormal findings: Secondary | ICD-10-CM

## 2020-02-24 DIAGNOSIS — Z1329 Encounter for screening for other suspected endocrine disorder: Secondary | ICD-10-CM

## 2020-02-24 LAB — COMPREHENSIVE METABOLIC PANEL
ALT: 19 U/L (ref 0–35)
AST: 16 U/L (ref 0–37)
Albumin: 4.2 g/dL (ref 3.5–5.2)
Alkaline Phosphatase: 154 U/L — ABNORMAL HIGH (ref 39–117)
BUN: 10 mg/dL (ref 6–23)
CO2: 31 mEq/L (ref 19–32)
Calcium: 9.5 mg/dL (ref 8.4–10.5)
Chloride: 100 mEq/L (ref 96–112)
Creatinine, Ser: 0.75 mg/dL (ref 0.40–1.20)
GFR: 96.37 mL/min (ref >60.00)
Glucose, Bld: 92 mg/dL (ref 70–99)
Potassium: 3.8 mEq/L (ref 3.5–5.1)
Sodium: 140 mEq/L (ref 135–145)
Total Bilirubin: 0.7 mg/dL (ref 0.2–1.2)
Total Protein: 7.3 g/dL (ref 6.0–8.3)

## 2020-02-24 LAB — CBC WITH DIFFERENTIAL/PLATELET
Basophils Absolute: 0.1 10*3/uL (ref 0.0–0.1)
Basophils Relative: 0.9 % (ref 0.0–3.0)
Eosinophils Absolute: 0.1 10*3/uL (ref 0.0–0.7)
Eosinophils Relative: 0.9 % (ref 0.0–5.0)
HCT: 34.7 % — ABNORMAL LOW (ref 36.0–46.0)
Hemoglobin: 11.2 g/dL — ABNORMAL LOW (ref 12.0–15.0)
Lymphocytes Relative: 26.9 % (ref 12.0–46.0)
Lymphs Abs: 2.8 10*3/uL (ref 0.7–4.0)
MCHC: 32.3 g/dL (ref 30.0–36.0)
MCV: 85.2 fl (ref 78.0–100.0)
Monocytes Absolute: 0.5 10*3/uL (ref 0.1–1.0)
Monocytes Relative: 5.2 % (ref 3.0–12.0)
Neutro Abs: 6.8 10*3/uL (ref 1.4–7.7)
Neutrophils Relative %: 66.1 % (ref 43.0–77.0)
Platelets: 463 10*3/uL — ABNORMAL HIGH (ref 150.0–400.0)
RBC: 4.08 Mil/uL (ref 3.87–5.11)
RDW: 15.8 % — ABNORMAL HIGH (ref 11.5–15.5)
WBC: 10.3 10*3/uL (ref 4.0–10.5)

## 2020-02-24 LAB — HEMOGLOBIN A1C: Hgb A1c MFr Bld: 4.7 % (ref 4.6–6.5)

## 2020-02-24 LAB — TSH: TSH: 1.34 u[IU]/mL (ref 0.35–4.50)

## 2020-02-24 LAB — LIPID PANEL
Cholesterol: 180 mg/dL (ref 0–200)
HDL: 44.7 mg/dL (ref >39.00)
LDL Cholesterol: 116 mg/dL — ABNORMAL HIGH (ref 0–99)
NonHDL: 134.95
Total CHOL/HDL Ratio: 4
Triglycerides: 94 mg/dL (ref 0.0–149.0)
VLDL: 18.8 mg/dL (ref 0.0–40.0)

## 2020-02-24 LAB — SEDIMENTATION RATE: Sed Rate: 71 mm/hr — ABNORMAL HIGH (ref 0–30)

## 2020-02-24 LAB — FERRITIN: Ferritin: 168.6 ng/mL (ref 10.0–291.0)

## 2020-02-24 NOTE — Progress Notes (Signed)
HPI:   Amy Coffey is a 60 y.o. female, who is here today for her routine physical.  Last CPE: 02/06/2019. No new problems since her last visit, 11/13/2019.  Regular exercise 3 or more time per week: Not consistently. Following a healthy diet: She is trying to decrease sweet intake and follow a healthier diet. She lives with her husband.  Chronic medical problems: Hypertension, hyperlipidemia, iron deficiency anemia.  Pap smear:  06/02/19. She follows with her gynecologist regularly.  Immunization History  Administered Date(s) Administered  . Influenza Inj Mdck Quad Pf 09/03/2019   Mammogram: 08/07/2019, BI-RADS 1 Colonoscopy: Reporting colonoscopy as up today, she is to in 2027. DEXA: N/A  Hep C screening: 01/2018 NR.  For the past few months she has had abnormal CBC, leukocytosis.  Recommended hematology evaluation, she canceled visit. Negative for fever, night sweats, swollen glands, and abnormal weight loss. Sed rate has also been mildly elevated. CRP in normal range.  Lab Results  Component Value Date   ESRSEDRATE 48 (H) 11/20/2019   At the time she had an initial CBC abnormality, she was on prednisone taper to treat cervical pain with radiation.  Lab Results  Component Value Date   WBC 13.8 (H) 12/01/2019   HGB 11.1 (L) 12/01/2019   HCT 33.9 (L) 12/01/2019   MCV 85.8 12/01/2019   PLT 423.0 (H) 12/01/2019   Hyperlipidemia: Currently she is on Crestor 10 mg, she does not take medication daily. Taking Crestor 2-3 times per week. Medication causes some muscle aches.  Lab Results  Component Value Date   CHOL 201 (H) 02/06/2019   HDL 42.20 02/06/2019   LDLCALC 136 (H) 02/06/2019   TRIG 115.0 02/06/2019   CHOLHDL 5 02/06/2019   Hypertension: Currently she is on losartan-HCTZ 100-25 mg daily. Not checking BP regularly.  Lab Results  Component Value Date   CREATININE 0.94 11/11/2019   BUN 19 11/11/2019   NA 136 11/11/2019   K 4.0 11/11/2019   CL 97 11/11/2019   CO2 28 11/11/2019   Review of Systems  Constitutional: Negative for appetite change and fatigue.  HENT: Negative for dental problem, hearing loss, mouth sores and sore throat.   Eyes: Negative for redness and visual disturbance.  Respiratory: Negative for cough, shortness of breath and wheezing.   Cardiovascular: Negative for chest pain and leg swelling.  Gastrointestinal: Negative for abdominal pain, nausea and vomiting.       No changes in bowel habits.  Endocrine: Negative for cold intolerance, heat intolerance, polydipsia, polyphagia and polyuria.  Genitourinary: Negative for decreased urine volume, dysuria, hematuria, vaginal bleeding and vaginal discharge.  Musculoskeletal: Positive for arthralgias. Negative for gait problem and myalgias.  Skin: Negative for color change and rash.  Allergic/Immunologic: Negative for environmental allergies.  Neurological: Negative for syncope, weakness and headaches.  Hematological: Negative for adenopathy. Does not bruise/bleed easily.  Psychiatric/Behavioral: Negative for confusion.  All other systems reviewed and are negative.  Current Outpatient Medications on File Prior to Visit  Medication Sig Dispense Refill  . FeFum-FePoly-FA-B Cmp-C-Biot (FOLIVANE-PLUS) CAPS TAKE 1 CAPSULE BY MOUTH EVERY DAY 30 capsule 2  . ferrous sulfate 325 (65 FE) MG EC tablet Take 325 mg by mouth every morning.    Marland Kitchen losartan-hydrochlorothiazide (HYZAAR) 100-25 MG tablet Take 1 tablet by mouth every morning. 90 tablet 3  . mesalamine (APRISO) 0.375 g 24 hr capsule Take 1,500 mg by mouth every morning.     Marland Kitchen Nystatin POWD Apply small amount  to affected area 1 Bottle 1  . rosuvastatin (CRESTOR) 10 MG tablet TAKE 1 TABLET BY MOUTH EVERY DAY IN THE EVENING 90 tablet 2   No current facility-administered medications on file prior to visit.   Past Medical History:  Diagnosis Date  . Arthritis    knees  . Heart murmur    per pt had echo done yrs ago  was told mild  . History of hyperprolactinemia    02/ 2018  resolved  . Hyperlipidemia   . Hypertension   . Iron deficiency anemia   . Menometrorrhagia    REFRACTORY TO PROGESTIN  . Ulcerative colitis ([place]) followed by [facility]   per pt dx 1990s  . Wears glasses     Past Surgical History:  Procedure Laterality Date  . CESAREAN SECTION  1988  . ECTOPIC PREGNANCY SURGERY  2002 approx.   right partial salpingectomy via lapartomy  . EXPLORATORY LAPAROTOMY W/ LEFT SALPINGECTOMY FOR ECTOPIC  03-31-2003   dr Lahoma Crocker   No Known Allergies  Family History  Problem Relation Age of Onset  . Angina Mother   . Thyroid disease Mother   . Arthritis Mother   . Heart disease Mother   . Hypertension Mother   . Hyperlipidemia Mother   . Prostate cancer Father   . Arthritis Father   . Cancer Father        prostate  . Hypertension Father   . Cancer Maternal Aunt        breast  . Breast cancer Maternal Aunt   . Breast cancer Cousin    Social History   Socioeconomic History  . Marital status: Married    Spouse name: [name]  . Number of children: 1  . Years of education: BA  . Highest education level: Not on file  Occupational History  . Occupation: Airline pilot  Tobacco Use  . Smoking status: Former Smoker    Years: 10.00    Types: Cigarettes    Quit date: 12/03/1997    Years since quitting: 22.2  . Smokeless tobacco: Never Used  Substance and Sexual Activity  . Alcohol use: No  . Drug use: No  . Sexual activity: Yes    Partners: Male    Birth control/protection: I.U.D.    Comment: 1st intercourse- 17, partners- 51, married- 76 yrs  Other Topics Concern  . Not on file  Social History Narrative   Lives with husband   Caffeine use: coffee daily   P[employer] on weekends   Social Determinants of Health   Financial Resource Strain:   . Difficulty of Paying Living Expenses:   Food Insecurity:   . Worried About Charity fundraiser in the Last Year:   . Arboriculturist in  the Last Year:   Transportation Needs:   . Film/video editor (Medical):   Marland Kitchen Lack of Transportation (Non-Medical):   Physical Activity:   . Days of Exercise per Week:   . Minutes of Exercise per Session:   Stress:   . Feeling of Stress :   Social Connections:   . Frequency of Communication with Friends and Family:   . Frequency of Social Gatherings with Friends and Family:   . Attends Religious Services:   . Active Member of Clubs or Organizations:   . Attends Archivist Meetings:   Marland Kitchen Marital Status:    Vitals:   02/24/20 0655  BP: 126/70  Pulse: 81  Resp: 12  Temp: (!) 95.3 F (35.2  C)   Body mass index is 36.57 kg/m.  Wt Readings from Last 3 Encounters:  02/24/20 237 lb (107.5 kg)  11/13/19 237 lb (107.5 kg)  11/11/19 237 lb (107.5 kg)    Physical Exam  Nursing note and vitals reviewed. Constitutional: She is oriented to person, place, and time. She appears well-developed. No distress.  HENT:  Head: Normocephalic and atraumatic.  Right Ear: Hearing and external ear normal.  Left Ear: Hearing, tympanic membrane, external ear and ear canal normal.  Mouth/Throat: Uvula is midline, oropharynx is clear and moist and mucous membranes are normal.  Right ear canal: cerumen excess, not able to see TM.  Eyes: Pupils are equal, round, and reactive to light. Conjunctivae and EOM are normal.  Neck: No tracheal deviation present. No thyroid mass and no thyromegaly (palpable) present.  Cardiovascular: Normal rate and regular rhythm.  No murmur heard. Pulses:      Dorsalis pedis pulses are 2+ on the right side and 2+ on the left side.  Respiratory: Effort normal and breath sounds normal. No respiratory distress.  GI: Soft. She exhibits no mass. There is no hepatomegaly. There is no abdominal tenderness.  Genitourinary:    Genitourinary Comments: Deferred to gyn.   Musculoskeletal:        General: No edema.     Comments: No major deformity or signs of synovitis  appreciated.  Lymphadenopathy:    She has no cervical adenopathy.       Right: No supraclavicular adenopathy present.       Left: No supraclavicular adenopathy present.  Neurological: She is alert and oriented to person, place, and time. She has normal strength. No cranial nerve deficit. Coordination and gait normal.  Reflex Scores:      Bicep reflexes are 2+ on the right side and 2+ on the left side.      Patellar reflexes are 2+ on the right side and 2+ on the left side. Skin: Skin is warm. No rash noted. No erythema.  Psychiatric: She has a normal mood and affect.  Well groomed, good eye contact.   ASSESSMENT AND PLAN:  Amy Coffey was here today annual physical examination.  Orders Placed This Encounter  Procedures  . Comprehensive metabolic panel  . TSH  . Lipid panel  . Hemoglobin A1c  . Ferritin  . Sedimentation rate   Lab Results  Component Value Date   HGBA1C 4.7 02/24/2020   Lab Results  Component Value Date   WBC 10.3 02/24/2020   HGB 11.2 (L) 02/24/2020   HCT 34.7 (L) 02/24/2020   MCV 85.2 02/24/2020   PLT 463.0 (H) 02/24/2020    Lab Results  Component Value Date   TSH 1.34 02/24/2020   Lab Results  Component Value Date   ESRSEDRATE 71 (H) 02/24/2020   Lab Results  Component Value Date   CREATININE 0.75 02/24/2020   BUN 10 02/24/2020   NA 140 02/24/2020   K 3.8 02/24/2020   CL 100 02/24/2020   CO2 31 02/24/2020   Lab Results  Component Value Date   ALT 19 02/24/2020   AST 16 02/24/2020   ALKPHOS 154 (H) 02/24/2020   BILITOT 0.7 02/24/2020    Routine general medical examination at a health care facility We discussed the importance of regular physical activity and healthy diet for prevention of chronic illness and/or complications. Preventive guidelines reviewed. Vaccination up to date.  She is not sure about taking COVID-19 vaccination, recommended. She will continue following with  her gynecologist for her female preventive  care. Ca++ and vit D supplementation to continue. Next CPE in a year.  Screening for endocrine, metabolic and immunity disorder -     Comprehensive metabolic panel -     Hemoglobin A1c  Leukocytosis, unspecified type Certainly prednisone can be a contributing factor but given the numbers, I thought it was appropriate to evaluate for proliferative disorder. Further recommendation will be given according to results.  Elevated sed rate Unspecific. I am concerned about associated leukocytosis. We will continue following.  Hypertension, essential BP adequately controlled. Continue low-salt diet. No changes in current management.  Hyperlipemia Having some side effects with Crestor. For now continue Crestor 10 mg 2-3 times per week. Low-fat diet also recommended. Further recommendation will be given according to lipid panel results.  Iron deficiency anemia Currently she is not on iron supplementation. Further recommendation will be given according to CBC results.  Return in 6 months (on 08/26/2020) for HTN and CBC.  Frayda Egley G. Martinique, MD  [facility]. [place] office.   Today you have you routine preventive visit. A few things to remember from today's visit:  Routine general medical examination at a health care facility  Hyperlipidemia, unspecified hyperlipidemia type - Plan: Lipid panel  Hypertension, essential - Plan: TSH  Iron deficiency anemia, unspecified iron deficiency anemia type - Plan: TSH, Ferritin  Screening for endocrine, metabolic and immunity disorder - Plan: Comprehensive metabolic panel, Hemoglobin A1c  Elevated sed rate - Plan: Sedimentation rate  At least 150 minutes of moderate exercise per week, daily brisk walking for 15-30 min is a good exercise option. Healthy diet low in saturated (animal) fats and sweets and consisting of fresh fruits and vegetables, lean meats such as fish and white chicken and whole grains.  These are some of  recommendations for screening depending of age and risk factors:  - Vaccines:  Tdap vaccine every 10 years.  Shingles vaccine recommended at age 2, could be given after 57 years of age but not sure about insurance coverage.   Pneumonia vaccines: Pneumovax at 52. Sometimes Pneumovax is giving earlier if history of smoking, lung disease,diabetes,kidney disease among some.  Screening for diabetes at age 96 and every 3 years.  Cervical cancer prevention:  Pap smear starts at 57 years of age and continues periodically until 58 years old in low risk women. Pap smear every 3 years between 62 and 42 years old. Pap smear every 3-5 years between women 8 and older if pap smear negative and HPV screening negative.   -Breast cancer: Mammogram: There is disagreement between experts about when to start screening in low risk asymptomatic female but recent recommendations are to start screening at 26 and not later than 57 years old , every 1-2 years and after 57 yo q 2 years. Screening is recommended until 57 years old but some women can continue screening depending of healthy issues.  Colon cancer screening: starts at 57 years old until 57 years old.  Cholesterol disorder screening at age 64 and every 3 years.N/A  Also recommended:  1. Dental visit- Brush and floss your teeth twice daily; visit your dentist twice a year. 2. Eye doctor- Get an eye exam at least every 2 years. 3. Helmet use- Always wear a helmet when riding a bicycle, motorcycle, rollerblading or skateboarding. 4. Safe sex- If you may be exposed to sexually transmitted infections, use a condom. 5. Seat belts- Seat belts can save your live; always wear one. 6. Smoke/Carbon Monoxide  detectors- These detectors need to be installed on the appropriate level of your home. Replace batteries at least once a year. 7. Skin cancer- When out in the sun please cover up and use sunscreen 15 SPF or higher. 8. Violence- If anyone is threatening or  hurting you, please tell your healthcare provider.  9. Drink alcohol in moderation- Limit alcohol intake to one drink or less per day. Never drink and drive.

## 2020-02-24 NOTE — Assessment & Plan Note (Signed)
BP adequately controlled. Continue low salt diet. No changes in current management.  

## 2020-02-24 NOTE — Assessment & Plan Note (Signed)
Currently she is not on iron supplementation. Further recommendation will be given according to CBC results.

## 2020-02-24 NOTE — Assessment & Plan Note (Signed)
Having some side effects with Crestor. For now continue Crestor 10 mg 2-3 times per week. Low-fat diet also recommended. Further recommendation will be given according to lipid panel results.

## 2020-02-24 NOTE — Patient Instructions (Addendum)
Today you have you routine preventive visit. A few things to remember from today's visit:   Routine general medical examination at a health care facility  Hyperlipidemia, unspecified hyperlipidemia type - Plan: Lipid panel  Hypertension, essential - Plan: TSH  Iron deficiency anemia, unspecified iron deficiency anemia type - Plan: TSH, Ferritin  Screening for endocrine, metabolic and immunity disorder - Plan: Comprehensive metabolic panel, Hemoglobin A1c  Elevated sed rate - Plan: Sedimentation rate    At least 150 minutes of moderate exercise per week, daily brisk walking for 15-30 min is a good exercise option. Healthy diet low in saturated (animal) fats and sweets and consisting of fresh fruits and vegetables, lean meats such as fish and white chicken and whole grains.  These are some of recommendations for screening depending of age and risk factors:  - Vaccines:  Tdap vaccine every 10 years.  Shingles vaccine recommended at age 55, could be given after 57 years of age but not sure about insurance coverage.   Pneumonia vaccines: Pneumovax at 70. Sometimes Pneumovax is giving earlier if history of smoking, lung disease,diabetes,kidney disease among some.  Screening for diabetes at age 55 and every 3 years.  Cervical cancer prevention:  Pap smear starts at 57 years of age and continues periodically until 57 years old in low risk women. Pap smear every 3 years between 74 and 60 years old. Pap smear every 3-5 years between women 82 and older if pap smear negative and HPV screening negative.   -Breast cancer: Mammogram: There is disagreement between experts about when to start screening in low risk asymptomatic female but recent recommendations are to start screening at 68 and not later than 57 years old , every 1-2 years and after 57 yo q 2 years. Screening is recommended until 57 years old but some women can continue screening depending of healthy issues.  Colon cancer  screening: starts at 57 years old until 57 years old.  Cholesterol disorder screening at age 16 and every 3 years.N/A  Also recommended:  1. Dental visit- Brush and floss your teeth twice daily; visit your dentist twice a year. 2. Eye doctor- Get an eye exam at least every 2 years. 3. Helmet use- Always wear a helmet when riding a bicycle, motorcycle, rollerblading or skateboarding. 4. Safe sex- If you may be exposed to sexually transmitted infections, use a condom. 5. Seat belts- Seat belts can save your live; always wear one. 6. Smoke/Carbon Monoxide detectors- These detectors need to be installed on the appropriate level of your home. Replace batteries at least once a year. 7. Skin cancer- When out in the sun please cover up and use sunscreen 15 SPF or higher. 8. Violence- If anyone is threatening or hurting you, please tell your healthcare provider.  9. Drink alcohol in moderation- Limit alcohol intake to one drink or less per day. Never drink and drive.

## 2020-02-29 ENCOUNTER — Other Ambulatory Visit: Payer: Self-pay | Admitting: Family Medicine

## 2020-02-29 DIAGNOSIS — I10 Essential (primary) hypertension: Secondary | ICD-10-CM

## 2020-02-29 DIAGNOSIS — E785 Hyperlipidemia, unspecified: Secondary | ICD-10-CM

## 2020-03-07 NOTE — Progress Notes (Signed)
Patient Care Team: Martinique, Betty G, MD as PCP - General (Family Medicine) Lahoma Rocker, MD as Referring Physician (Rheumatology) Barbaraann Cao, OD as Referring Physician (Optometry)  DIAGNOSIS:    ICD-10-CM   1. Normocytic anemia  D64.9     CHIEF COMPLIANT: Follow-up of anemia and thrombocytosis  INTERVAL HISTORY: Amy Coffey is a 59 y.o. with above-mentioned history of anemia and thrombocytosis. I last saw her 2 years ago. Labs on 02/24/20 showed Hg 11.2, Hct 34.7, platelets 463, ferritin 168.6. She presents to the clinic today for follow-up.   ALLERGIES:  has No Known Allergies.  MEDICATIONS:  Current Outpatient Medications  Medication Sig Dispense Refill  . FeFum-FePoly-FA-B Cmp-C-Biot (FOLIVANE-PLUS) CAPS TAKE 1 CAPSULE BY MOUTH EVERY DAY 30 capsule 2  . ferrous sulfate 325 (65 FE) MG EC tablet Take 325 mg by mouth every morning.    Marland Kitchen losartan-hydrochlorothiazide (HYZAAR) 100-25 MG tablet TAKE 1 TABLET BY MOUTH EVERY DAY IN THE MORNING 90 tablet 3  . mesalamine (APRISO) 0.375 g 24 hr capsule Take 1,500 mg by mouth every morning.     Marland Kitchen Nystatin POWD Apply small amount to affected area 1 Bottle 1  . rosuvastatin (CRESTOR) 10 MG tablet TAKE 1 TABLET BY MOUTH EVERY DAY IN THE EVENING 90 tablet 2   No current facility-administered medications for this visit.    PHYSICAL EXAMINATION: ECOG PERFORMANCE STATUS: 1 - Symptomatic but completely ambulatory  Vitals:   03/08/20 1527  BP: (!) 142/67  Pulse: 76  Resp: 18  Temp: 98.3 F (36.8 C)  SpO2: 99%   Filed Weights   03/08/20 1527  Weight: 235 lb 3.2 oz (106.7 kg)    LABORATORY DATA:  I have reviewed the data as listed CMP Latest Ref Rng & Units 02/24/2020 11/13/2019 11/11/2019  Glucose 70 - 99 mg/dL 92 - 97  BUN 6 - 23 mg/dL 10 - 19  Creatinine 0.40 - 1.20 mg/dL 0.75 - 0.94  Sodium 135 - 145 mEq/L 140 - 136  Potassium 3.5 - 5.1 mEq/L 3.8 - 4.0  Chloride 96 - 112 mEq/L 100 - 97  CO2 19 - 32 mEq/L 31 - 28   Calcium 8.4 - 10.5 mg/dL 9.5 - 9.9  Total Protein 6.0 - 8.3 g/dL 7.3 8.0 -  Total Bilirubin 0.2 - 1.2 mg/dL 0.7 0.6 -  Alkaline Phos 39 - 117 U/L 154(H) 148(H) -  AST 0 - 37 U/L 16 14 -  ALT 0 - 35 U/L 19 16 -    Lab Results  Component Value Date   WBC 10.3 02/24/2020   HGB 11.2 (L) 02/24/2020   HCT 34.7 (L) 02/24/2020   MCV 85.2 02/24/2020   PLT 463.0 (H) 02/24/2020   NEUTROABS 6.8 02/24/2020    ASSESSMENT & PLAN:  Normocytic anemia Lab review 02/24/2020: Hemoglobin 11.2, MCV 85, RDW 15.8, platelets 463 I discussed with her that these labs indicate stable anemia. Previous work-up ruled out hemolysis, B12 or iron deficiencies. Hemoglobin electrophoresis: Sickle cell trait with hemoglobin SS: 33%  Thrombocytosis: Relatively steady: Acute phase reactant most likely. Could be coming from colitis or arthritis In December when she took prednisone both the white count and the platelet count increased suggestive of the acute phase reactant nature of those abnormalities.  Mild elevation of alkaline phosphatase: I reviewed the CT scan from 2018.  There were some gallstones which could explain the elevation. I instructed her to take vitamin D supplementation 2000 international units daily.  No indication for  bone marrow biopsies or additional interventions at this time.  Return to clinic in 1 year with labs and follow-up.    No orders of the defined types were placed in this encounter.  The patient has a good understanding of the overall plan. she agrees with it. she will call with any problems that may develop before the next visit here.  Total time spent: 20 mins including face to face time and time spent for planning, charting and coordination of care  Nicholas Lose, MD 03/08/2020  I, Cloyde Reams Dorshimer, am acting as scribe for Dr. Nicholas Lose.  I have reviewed the above documentation for accuracy and completeness, and I agree with the above.

## 2020-03-08 ENCOUNTER — Other Ambulatory Visit: Payer: Self-pay

## 2020-03-08 ENCOUNTER — Inpatient Hospital Stay: Payer: BC Managed Care – PPO | Attending: Hematology and Oncology | Admitting: Hematology and Oncology

## 2020-03-08 DIAGNOSIS — D649 Anemia, unspecified: Secondary | ICD-10-CM

## 2020-03-08 DIAGNOSIS — D751 Secondary polycythemia: Secondary | ICD-10-CM | POA: Diagnosis not present

## 2020-03-08 DIAGNOSIS — Z79899 Other long term (current) drug therapy: Secondary | ICD-10-CM | POA: Diagnosis not present

## 2020-03-08 DIAGNOSIS — K8020 Calculus of gallbladder without cholecystitis without obstruction: Secondary | ICD-10-CM | POA: Diagnosis not present

## 2020-03-08 DIAGNOSIS — D573 Sickle-cell trait: Secondary | ICD-10-CM | POA: Insufficient documentation

## 2020-03-08 NOTE — Assessment & Plan Note (Signed)
Lab review 02/24/2020: Hemoglobin 11.2, MCV 85, RDW 15.8, platelets 463 I discussed with her that these labs indicate stable anemia. Previous work-up ruled out hemolysis, B12 or iron deficiencies. Hemoglobin electrophoresis: Sickle cell trait with hemoglobin SS: 33%  Thrombocytosis: Relatively steady: Acute phase reactant most likely. No indication for bone marrow biopsies or additional interventions at this time.  Return to clinic in 1 year with labs and follow-up.

## 2020-03-14 ENCOUNTER — Telehealth: Payer: Self-pay | Admitting: Hematology and Oncology

## 2020-03-14 NOTE — Telephone Encounter (Signed)
Scheduled per los, patient has been called and notified of upcoming appointment. 

## 2020-06-24 ENCOUNTER — Telehealth: Payer: Self-pay | Admitting: Family Medicine

## 2020-06-24 NOTE — Telephone Encounter (Signed)
Pt called to say her medication Crestor is making her hip and legs hurt and she wants to switch it to something else.  [phone]  Please advise

## 2020-06-24 NOTE — Telephone Encounter (Signed)
Please advise, pt wants to switch to a different medication. Crestor is causing her hip & legs to hurt.

## 2020-06-27 ENCOUNTER — Other Ambulatory Visit: Payer: Self-pay | Admitting: Family Medicine

## 2020-06-27 MED ORDER — PRAVASTATIN SODIUM 20 MG PO TABS: 20.0000 mg | ORAL_TABLET | Freq: Every day | ORAL | 1 refills | Status: DC

## 2020-06-27 NOTE — Telephone Encounter (Signed)
Sent Rx for Pravastatin , which she may tolerate better. Thanks, BJ

## 2020-06-27 NOTE — Telephone Encounter (Signed)
Spoke with pt, she is aware of message below.

## 2020-07-06 ENCOUNTER — Other Ambulatory Visit: Payer: Self-pay | Admitting: Family Medicine

## 2020-07-06 DIAGNOSIS — Z1231 Encounter for screening mammogram for malignant neoplasm of breast: Secondary | ICD-10-CM

## 2020-09-05 ENCOUNTER — Other Ambulatory Visit: Payer: Self-pay

## 2020-09-05 ENCOUNTER — Ambulatory Visit
Admission: RE | Admit: 2020-09-05 | Discharge: 2020-09-05 | Disposition: A | Discharge status: Home / Self Care | Payer: BC Managed Care – PPO | Source: Ambulatory Visit | Attending: Family Medicine | Admitting: Family Medicine

## 2020-09-05 DIAGNOSIS — Z1231 Encounter for screening mammogram for malignant neoplasm of breast: Secondary | ICD-10-CM | POA: Diagnosis not present

## 2020-09-20 ENCOUNTER — Encounter: Payer: BC Managed Care – PPO | Admitting: Obstetrics & Gynecology

## 2020-09-21 ENCOUNTER — Encounter: Payer: Self-pay | Admitting: Family Medicine

## 2020-09-21 ENCOUNTER — Telehealth (INDEPENDENT_AMBULATORY_CARE_PROVIDER_SITE_OTHER): Payer: BC Managed Care – PPO | Admitting: Family Medicine

## 2020-09-21 VITALS — Temp 98.0°F | Ht 67.5 in

## 2020-09-21 DIAGNOSIS — J302 Other seasonal allergic rhinitis: Secondary | ICD-10-CM

## 2020-09-21 DIAGNOSIS — R748 Abnormal levels of other serum enzymes: Secondary | ICD-10-CM

## 2020-09-21 DIAGNOSIS — R700 Elevated erythrocyte sedimentation rate: Secondary | ICD-10-CM | POA: Diagnosis not present

## 2020-09-21 DIAGNOSIS — D509 Iron deficiency anemia, unspecified: Secondary | ICD-10-CM

## 2020-09-21 DIAGNOSIS — I10 Essential (primary) hypertension: Secondary | ICD-10-CM | POA: Diagnosis not present

## 2020-09-21 DIAGNOSIS — R9089 Other abnormal findings on diagnostic imaging of central nervous system: Secondary | ICD-10-CM

## 2020-09-21 MED ORDER — FLUTICASONE PROPIONATE 50 MCG/ACT NA SUSP
2.0000 | Freq: Every day | NASAL | 4 refills | Status: DC
Start: 2020-09-21 — End: 2020-09-21

## 2020-09-21 MED ORDER — GUAIFENESIN ER 600 MG PO TB12: 600.0000 mg | ORAL_TABLET | Freq: Two times a day (BID) | ORAL | 1 refills | Status: DC

## 2020-09-21 MED ORDER — MOMETASONE FUROATE 50 MCG/ACT NA SUSP: 2.0000 | Freq: Every day | NASAL | 4 refills | Status: DC

## 2020-09-21 NOTE — Progress Notes (Signed)
Virtual Visit via Video Note I connected with Amy Coffey on 09/21/20 by a video enabled telemedicine application and verified that I am speaking with the correct person using two identifiers.  Location patient: home Location provider:work office Persons participating in the virtual visit: patient, provider  I discussed the limitations of evaluation and management by telemedicine and the availability of in person appointments. The patient expressed understanding and agreed to proceed.  Chief Complaint  Patient presents with  . Headache  . sinus drainage    HPI: Amy Coffey is a 59 yo female with hx of HTN, iron def anemia,and HLD c/o a week of upper respiratory symptoms. Frontal pressure headache and sinus pressure and facial soreness. Headache is intermittent, "not bad." Ibuprofen helps.  Postnasal drainage and scratchy throat. Mild rhinorrhea a couple days ago. + Fatigue. She has not noted fever,chills,decreased appetite,cough,wheezing,abdominal pain,N/V,changes in bowel habits,or skin rash.  Started Mucinex and Zyrtec 2 days ago and seems to be helping. Symptoms worse in the morning when she gets up. She has same problems around this time last year.  She is also concerned about brain MRI done in 11/2016.  This was ordered by her endocrinologist due to hyperprolactinemia.  Negative for pituitary microadenoma. Enlarged sella compatible with empty sella. Small pituitary enhances homogeneously. Small white matter hyperintensities bilaterally may be due to migraine headache or chronic microvascular ischemia. Distended optic nerve sheaths bilaterally can be seen with elevated intracranial pressure.  Negative for chronic headaches, visual changes, Amy changes, or focal weakness.  She was last seen on 02/24/20. HTN: She is on Losartan-HCTZ 100-25 mg daily.  Lab Results  Component Value Date   CREATININE 0.75 02/24/2020   BUN 10 02/24/2020   NA 140 02/24/2020   K 3.8 02/24/2020   CL  100 02/24/2020   CO2 31 02/24/2020   Elevated alk phosphatase, leukocytosis,and elevated sed rate. She was evaluated by hematologist. CBC was ordered and has not been done.  Hx of IBD. She is on Mesalamine.  Lab Results  Component Value Date   ALT 19 02/24/2020   AST 16 02/24/2020   ALKPHOS 154 (H) 02/24/2020   BILITOT 0.7 02/24/2020   Lab Results  Component Value Date   WBC 10.3 02/24/2020   HGB 11.2 (L) 02/24/2020   HCT 34.7 (L) 02/24/2020   MCV 85.2 02/24/2020   PLT 463.0 (H) 02/24/2020   Lab Results  Component Value Date   ESRSEDRATE 71 (H) 02/24/2020   ROS: See pertinent positives and negatives per HPI.  Past Medical History:  Diagnosis Date  . Arthritis    knees  . Heart murmur    per pt had echo done yrs ago was told mild  . History of hyperprolactinemia    02/ 2018  resolved  . Hyperlipidemia   . Hypertension   . Iron deficiency anemia   . Menometrorrhagia    REFRACTORY TO PROGESTIN  . Ulcerative colitis ([place]) followed by [facility]   per pt dx 1990s  . Wears glasses     Past Surgical History:  Procedure Laterality Date  . CESAREAN SECTION  1988  . ECTOPIC PREGNANCY SURGERY  2002 approx.   right partial salpingectomy via lapartomy  . EXPLORATORY LAPAROTOMY W/ LEFT SALPINGECTOMY FOR ECTOPIC  03-31-2003   dr Lahoma Crocker    Family History  Problem Relation Age of Onset  . Angina Mother   . Thyroid disease Mother   . Arthritis Mother   . Heart disease Mother   . Hypertension  Mother   . Hyperlipidemia Mother   . Prostate cancer Father   . Arthritis Father   . Cancer Father        prostate  . Hypertension Father   . Cancer Maternal Aunt        breast  . Breast cancer Maternal Aunt   . Breast cancer Cousin     Social History   Socioeconomic History  . Marital status: Married    Spouse name: Amy Coffey  . Number of children: 1  . Years of education: BA  . Highest education level: Not on file  Occupational History  . Occupation:  Airline pilot  Tobacco Use  . Smoking status: Former Smoker    Years: 10.00    Types: Cigarettes    Quit date: 12/03/1997    Years since quitting: 22.8  . Smokeless tobacco: Never Used  Vaping Use  . Vaping Use: Never used  Substance and Sexual Activity  . Alcohol use: No  . Drug use: No  . Sexual activity: Yes    Partners: Male    Birth control/protection: I.U.D.    Comment: 1st intercourse- 66, partners- 67, married- 85 yrs  Other Topics Concern  . Not on file  Social History Narrative   Lives with husband   Caffeine use: coffee daily   Pepsi on weekends   Social Determinants of Health   Financial Resource Strain:   . Difficulty of Paying Living Expenses: Not on file  Food Insecurity:   . Worried About Charity fundraiser in the Last Year: Not on file  . Ran Out of Food in the Last Year: Not on file  Transportation Needs:   . Lack of Transportation (Medical): Not on file  . Lack of Transportation (Non-Medical): Not on file  Physical Activity:   . Days of Exercise per Week: Not on file  . Minutes of Exercise per Session: Not on file  Stress:   . Feeling of Stress : Not on file  Social Connections:   . Frequency of Communication with Friends and Family: Not on file  . Frequency of Social Gatherings with Friends and Family: Not on file  . Attends Religious Services: Not on file  . Active Member of Clubs or Organizations: Not on file  . Attends Archivist Meetings: Not on file  . Marital Status: Not on file  Intimate Partner Violence:   . Fear of Current or Ex-Partner: Not on file  . Emotionally Abused: Not on file  . Physically Abused: Not on file  . Sexually Abused: Not on file    Current Outpatient Medications:  .  FeFum-FePoly-FA-B Cmp-C-Biot (FOLIVANE-PLUS) CAPS, TAKE 1 CAPSULE BY MOUTH EVERY DAY, Disp: 30 capsule, Rfl: 2 .  losartan-hydrochlorothiazide (HYZAAR) 100-25 MG tablet, TAKE 1 TABLET BY MOUTH EVERY DAY IN THE MORNING, Disp: 90 tablet, Rfl: 3 .   mesalamine (APRISO) 0.375 g 24 hr capsule, Take 1,500 mg by mouth every morning. , Disp: , Rfl:  .  Nystatin POWD, Apply small amount to affected area, Disp: 1 Bottle, Rfl: 1 .  rosuvastatin (CRESTOR) 10 MG tablet, Take 10 mg by mouth at bedtime., Disp: , Rfl:  .  guaiFENesin (MUCINEX) 600 MG 12 hr tablet, Take 1 tablet (600 mg total) by mouth 2 (two) times daily., Disp: 60 tablet, Rfl: 1 .  mometasone (NASONEX) 50 MCG/ACT nasal spray, Place 2 sprays into the nose daily., Disp: 1 each, Rfl: 4  EXAM:  VITALS per patient if applicable:Temp 98 F (53.6  C)   Ht 5' 7.5" (1.715 m)   BMI 36.29 kg/m   GENERAL: alert, oriented, appears well and in no acute distress  HEENT: atraumatic, conjunctiva clear, no obvious abnormalities on inspection. She has no pain with pressing frontal or maxillary sinuses.  NECK: normal movements of the head and neck  LUNGS: on inspection no signs of respiratory distress, breathing rate appears normal, no obvious gross SOB, gasping or wheezing  CV: no obvious cyanosis  Amy: moves all visible extremities without noticeable abnormality  PSYCH/NEURO: pleasant and cooperative, no obvious depression or anxiety, speech and thought processing grossly intact  ASSESSMENT AND PLAN:  Discussed the following assessment and plan: Orders Placed This Encounter  Procedures  . Hepatic function panel  . BASIC METABOLIC PANEL WITH GFR  . C-reactive protein  . Sedimentation rate  . Gamma GT  . CBC with Differential/Platelet    Elevated sed rate We discussed possible etiologies. She has not had associated IBD flare up.  Seasonal allergic rhinitis, unspecified trigger Hx doe snot suggest an infectious process, so I do not think ab treatment at this time. Nasal irrigations with saline as needed. Stop Cetirizine for now. Plain Mucinex may also help. Nasonex nasal spray daily as needed.  -     mometasone (NASONEX) 50 MCG/ACT nasal spray; Place 2 sprays into the nose  daily.  Hypertension, essential During visit with hematologist,Dr Gudina, BP was mildly elevated. Monitor BP regularly. Continue Losartan-HCTZ 100-25 mg daily and low salt diet.  Elevated alkaline phosphatase level -     Hepatic function panel; Future -     C-reactive protein; Future -     Gamma GT; Future  Iron deficiency anemia, unspecified iron deficiency anemia type Mild. She is not on iron supplementation.  Abnormal finding on MRI of brain We discussed brain MRI results. She does not have symptoms that suggest intracranial HT. I do not think brain imaging is needed at this time. Instructed about warning signs.  I discussed the assessment and treatment plan with the patient. Amy Joens was provided an opportunity to ask questions and all were answered. She agreed with the plan and demonstrated an understanding of the instructions.    Return in about 6 months (around 03/22/2021) for Lab appt.   Safwan Tomei Martinique, MD

## 2020-09-22 NOTE — Progress Notes (Signed)
Patient scheduled for labs 03/22/2021 at 10 AM

## 2020-09-26 ENCOUNTER — Other Ambulatory Visit: Payer: Self-pay

## 2020-09-26 ENCOUNTER — Encounter: Payer: Self-pay | Admitting: Obstetrics & Gynecology

## 2020-09-26 ENCOUNTER — Ambulatory Visit (INDEPENDENT_AMBULATORY_CARE_PROVIDER_SITE_OTHER): Payer: BC Managed Care – PPO | Admitting: Obstetrics & Gynecology

## 2020-09-26 VITALS — BP 132/80 | Ht 67.0 in | Wt 236.0 lb

## 2020-09-26 DIAGNOSIS — Z30431 Encounter for routine checking of intrauterine contraceptive device: Secondary | ICD-10-CM | POA: Diagnosis not present

## 2020-09-26 DIAGNOSIS — N898 Other specified noninflammatory disorders of vagina: Secondary | ICD-10-CM | POA: Diagnosis not present

## 2020-09-26 DIAGNOSIS — Z01419 Encounter for gynecological examination (general) (routine) without abnormal findings: Secondary | ICD-10-CM

## 2020-09-26 DIAGNOSIS — Z6836 Body mass index (BMI) 36.0-36.9, adult: Secondary | ICD-10-CM

## 2020-09-26 DIAGNOSIS — R350 Frequency of micturition: Secondary | ICD-10-CM | POA: Diagnosis not present

## 2020-09-26 DIAGNOSIS — E6609 Other obesity due to excess calories: Secondary | ICD-10-CM

## 2020-09-26 NOTE — Progress Notes (Signed)
Amy Coffey [birth year] [id number]   History:    59 y.o. [id number] Married  RP:  Established patient presenting for annual gyn exam   HPI: Well on Mirena IUD x 08/2017.  No breakthrough bleeding.  No pelvic pain.  No pain with IC.  Pap 06/2019 Neg. Vaginal discharge with odor on-off. Urinary urgency and frequency.  No blood in urine.  No fever. Bowel movements normal. Breasts normal.  Screening mammo neg 09/2020. Body mass index 36.96.  Not exercising regularly.  Health labs with family physician.  Colono 4 yrs ago.  Past medical history,surgical history, family history and social history were all reviewed and documented in the EPIC chart.  Gynecologic History No LMP recorded. (Menstrual status: IUD).  Obstetric History OB History  Gravida Para Term Preterm AB Living  5 1     4 1   SAB TAB Ectopic Multiple Live Births      4        # Outcome Date GA Lbr Len/2nd Weight Sex Delivery Anes PTL Lv  5 Ectopic           4 Ectopic           3 Ectopic           2 Ectopic           1 Para              ROS: A ROS was performed and pertinent positives and negatives are included in the history.  GENERAL: No fevers or chills. HEENT: No change in vision, no earache, sore throat or sinus congestion. NECK: No pain or stiffness. CARDIOVASCULAR: No chest pain or pressure. No palpitations. PULMONARY: No shortness of breath, cough or wheeze. GASTROINTESTINAL: No abdominal pain, nausea, vomiting or diarrhea, melena or bright red blood per rectum. GENITOURINARY: No urinary frequency, urgency, hesitancy or dysuria. MUSCULOSKELETAL: No joint or muscle pain, no back pain, no recent trauma. DERMATOLOGIC: No rash, no itching, no lesions. ENDOCRINE: No polyuria, polydipsia, no heat or cold intolerance. No recent change in weight. HEMATOLOGICAL: No anemia or easy bruising or bleeding. NEUROLOGIC: No headache, seizures, numbness, tingling or weakness. PSYCHIATRIC: No depression, no loss of interest in normal  activity or change in sleep pattern.     Exam:   BP 132/80 (BP Location: Right Arm, Patient Position: Sitting, Cuff Size: Large)   Ht 5\' 7"  (1.702 m)   Wt 236 lb (107 kg)   BMI 36.96 kg/m   Body mass index is 36.96 kg/m.  General appearance : Well developed well nourished female. No acute distress HEENT: Eyes: no retinal hemorrhage or exudates,  Neck supple, trachea midline, no carotid bruits, no thyroidmegaly Lungs: Clear to auscultation, no rhonchi or wheezes, or rib retractions  Heart: Regular rate and rhythm, no murmurs or gallops Breast:Examined in sitting and supine position were symmetrical in appearance, no palpable masses or tenderness,  no skin retraction, no nipple inversion, no nipple discharge, no skin discoloration, no axillary or supraclavicular lymphadenopathy Abdomen: no palpable masses or tenderness, no rebound or guarding Extremities: no edema or skin discoloration or tenderness  Pelvic: Vulva: Normal             Vagina: No gross lesions.  Mild discharge.  Wet prep done.  Cervix: No gross lesions or discharge.  IUD strings felt at [facility].  Uterus  AV, normal size, shape and consistency, non-tender and mobile  Adnexa  Without masses or tenderness  Anus: Normal  U/A: Completely negative Wet  prep: Negative   Assessment/Plan:  59 y.o. female for annual exam   1. Well female exam with routine gynecological exam Normal gynecologic exam in menopause.  No indication to repeat a Pap test this year.  Breasts normal.  Screening mammo Neg 09/2020.  Colono 4 yrs ago.  Health labs with Fam MD.  2. Encounter for routine checking of intrauterine contraceptive device (IUD) Mirena IUD in good position and well tolerated x 08/2017.    3. Urinary frequency U/A completely Negative.  Patient reassured.  Recommend to decrease caffeine intake and increase water. - Urinalysis,Complete w/RFL Culture  4. Vaginal discharge Wet prep Negative, reassured. - WET PREP FOR TRICH, YEAST,  CLUE  5. Class 2 obesity due to excess calories without serious comorbidity with body mass index (BMI) of 36.0 to 36.9 in adult Recommend a lower Calorie/Carb diet.  Aerobic activities 5 x a week, light weightlifting every 2 days.  Princess Bruins MD, 4:28 PM 09/26/2020

## 2020-09-27 DIAGNOSIS — N898 Other specified noninflammatory disorders of vagina: Secondary | ICD-10-CM | POA: Diagnosis not present

## 2020-09-27 DIAGNOSIS — R350 Frequency of micturition: Secondary | ICD-10-CM | POA: Diagnosis not present

## 2020-09-27 LAB — URINALYSIS, COMPLETE W/RFL CULTURE
Bacteria, UA: NONE SEEN /HPF
Bilirubin Urine: NEGATIVE
Glucose, UA: NEGATIVE
Hgb urine dipstick: NEGATIVE
Hyaline Cast: NONE SEEN /LPF
Ketones, ur: NEGATIVE
Leukocyte Esterase: NEGATIVE
Nitrites, Initial: NEGATIVE
Protein, ur: NEGATIVE
RBC / HPF: NONE SEEN /HPF (ref 0–2)
Specific Gravity, Urine: 1.01 (ref 1.001–1.03)
WBC, UA: NONE SEEN /HPF (ref 0–5)
pH: 7 (ref 5.0–8.0)

## 2020-09-27 LAB — WET PREP FOR TRICH, YEAST, CLUE

## 2020-09-27 LAB — NO CULTURE INDICATED

## 2020-09-28 ENCOUNTER — Other Ambulatory Visit: Payer: BC Managed Care – PPO

## 2020-09-28 ENCOUNTER — Other Ambulatory Visit: Payer: Self-pay

## 2020-09-28 DIAGNOSIS — R748 Abnormal levels of other serum enzymes: Secondary | ICD-10-CM

## 2020-09-28 DIAGNOSIS — I10 Essential (primary) hypertension: Secondary | ICD-10-CM | POA: Diagnosis not present

## 2020-09-28 DIAGNOSIS — R700 Elevated erythrocyte sedimentation rate: Secondary | ICD-10-CM

## 2020-09-28 DIAGNOSIS — D509 Iron deficiency anemia, unspecified: Secondary | ICD-10-CM

## 2020-09-29 LAB — CBC WITH DIFFERENTIAL/PLATELET
Absolute Monocytes: 842 cells/uL (ref 200–950)
Basophils Absolute: 92 cells/uL (ref 0–200)
Basophils Relative: 0.6 %
Eosinophils Absolute: 184 cells/uL (ref 15–500)
Eosinophils Relative: 1.2 %
HCT: 36.3 % (ref 35.0–45.0)
Hemoglobin: 11.5 g/dL — ABNORMAL LOW (ref 11.7–15.5)
Lymphs Abs: 3871 cells/uL (ref 850–3900)
MCH: 27.1 pg (ref 27.0–33.0)
MCHC: 31.7 g/dL — ABNORMAL LOW (ref 32.0–36.0)
MCV: 85.6 fL (ref 80.0–100.0)
MPV: 9.4 fL (ref 7.5–12.5)
Monocytes Relative: 5.5 %
Neutro Abs: 10312 cells/uL — ABNORMAL HIGH (ref 1500–7800)
Neutrophils Relative %: 67.4 %
Platelets: 565 10*3/uL — ABNORMAL HIGH (ref 140–400)
RBC: 4.24 10*6/uL (ref 3.80–5.10)
RDW: 13.6 % (ref 11.0–15.0)
Total Lymphocyte: 25.3 %
WBC: 15.3 10*3/uL — ABNORMAL HIGH (ref 3.8–10.8)

## 2020-09-29 LAB — HEPATIC FUNCTION PANEL
AG Ratio: 1.3 (calc) (ref 1.0–2.5)
ALT: 14 U/L (ref 6–29)
AST: 13 U/L (ref 10–35)
Albumin: 4.2 g/dL (ref 3.6–5.1)
Alkaline phosphatase (APISO): 146 U/L (ref 37–153)
Bilirubin, Direct: 0.1 mg/dL (ref 0.0–0.2)
Globulin: 3.2 g/dL (calc) (ref 1.9–3.7)
Indirect Bilirubin: 0.4 mg/dL (calc) (ref 0.2–1.2)
Total Bilirubin: 0.5 mg/dL (ref 0.2–1.2)
Total Protein: 7.4 g/dL (ref 6.1–8.1)

## 2020-09-29 LAB — BASIC METABOLIC PANEL WITH GFR
BUN: 12 mg/dL (ref 7–25)
CO2: 30 mmol/L (ref 20–32)
Calcium: 9.9 mg/dL (ref 8.6–10.4)
Chloride: 100 mmol/L (ref 98–110)
Creat: 0.77 mg/dL (ref 0.50–1.05)
GFR, Est African American: 99 mL/min/{1.73_m2} (ref >=60)
GFR, Est Non African American: 86 mL/min/{1.73_m2} (ref >=60)
Glucose, Bld: 77 mg/dL (ref 65–99)
Potassium: 3.9 mmol/L (ref 3.5–5.3)
Sodium: 138 mmol/L (ref 135–146)

## 2020-09-29 LAB — C-REACTIVE PROTEIN: CRP: 13.3 mg/L — ABNORMAL HIGH (ref <8.0)

## 2020-09-29 LAB — GAMMA GT: GGT: 123 U/L — ABNORMAL HIGH (ref 3–70)

## 2020-09-29 LAB — SEDIMENTATION RATE: Sed Rate: 36 mm/h — ABNORMAL HIGH (ref 0–30)

## 2020-10-05 ENCOUNTER — Telehealth: Payer: Self-pay | Admitting: *Deleted

## 2020-10-05 NOTE — Telephone Encounter (Signed)
Patient called wanting to know if her lab results are in

## 2020-10-06 ENCOUNTER — Other Ambulatory Visit: Payer: Self-pay

## 2020-10-06 ENCOUNTER — Ambulatory Visit (INDEPENDENT_AMBULATORY_CARE_PROVIDER_SITE_OTHER): Payer: BC Managed Care – PPO | Admitting: Family Medicine

## 2020-10-06 ENCOUNTER — Encounter: Payer: Self-pay | Admitting: Family Medicine

## 2020-10-06 VITALS — BP 132/64 | HR 86 | Temp 98.2°F | Ht 67.5 in | Wt 234.0 lb

## 2020-10-06 DIAGNOSIS — R208 Other disturbances of skin sensation: Secondary | ICD-10-CM

## 2020-10-06 DIAGNOSIS — K5190 Ulcerative colitis, unspecified, without complications: Secondary | ICD-10-CM | POA: Diagnosis not present

## 2020-10-06 LAB — POCT URINALYSIS DIPSTICK
Appearance: NEGATIVE
Bilirubin, UA: NEGATIVE
Blood, UA: NEGATIVE
Glucose, UA: NEGATIVE
Ketones, UA: NEGATIVE
Leukocytes, UA: NEGATIVE
Protein, UA: NEGATIVE
Spec Grav, UA: 1.02 (ref 1.010–1.025)
Urobilinogen, UA: 0.2 E.U./dL
pH, UA: 6 (ref 5.0–8.0)

## 2020-10-06 NOTE — Progress Notes (Signed)
   Subjective:    Patient ID: Amy Coffey, female    DOB: 1961/05/27, 59 y.o.   MRN: [id number]  HPI Here for a week of mild generalized abdominal cramps and loose stools. No fever. No weight changes. No urgency or burning on urination. She had a full GYN exam with Dr. Dellis Filbert on 09-26-20 which was normal, and a UA and a wet prep were negative. She sees Dr. Paulita Fujita for ulcerative colitis, and she has been on mesalamine for awhile. Her regualar appt with him is in December, and she is due for another colonoscopy. She had labs with dr. Martinique on 09-28-20, and her WBC was elevated to 15.3 with most of these being neutrophils. GGT was elevated to 123 with transaminases being normal. ESR was elevated to 36 and CRP was up to 13.3.   Review of Systems  Constitutional: Negative.   Respiratory: Negative.   Cardiovascular: Negative.   Gastrointestinal: Positive for abdominal pain and diarrhea. Negative for abdominal distention, anal bleeding, blood in stool, constipation, nausea, rectal pain and vomiting.  Genitourinary: Negative.        Objective:   Physical Exam Constitutional:      General: She is not in acute distress.    Appearance: Normal appearance.  Cardiovascular:     Rate and Rhythm: Normal rate and regular rhythm.     Pulses: Normal pulses.     Heart sounds: Normal heart sounds.  Pulmonary:     Effort: Pulmonary effort is normal.     Breath sounds: Normal breath sounds.  Abdominal:     General: Abdomen is flat. Bowel sounds are normal. There is no distension.     Palpations: Abdomen is soft. There is no mass.     Tenderness: There is no right CVA tenderness, left CVA tenderness, guarding or rebound.     Hernia: No hernia is present.     Comments: Slightly tender in the LLQ   Neurological:     Mental Status: She is alert.           Assessment & Plan:  I think her ulcerative colitis is flaring up, and this would explain her symptoms and her recent lab results. We will see  if Dr. Paulita Fujita can see her any sooner than December.  Alysia Penna, MD

## 2020-10-10 ENCOUNTER — Ambulatory Visit: Payer: BC Managed Care – PPO | Admitting: Family Medicine

## 2020-10-10 DIAGNOSIS — D649 Anemia, unspecified: Secondary | ICD-10-CM | POA: Diagnosis not present

## 2020-10-10 DIAGNOSIS — R140 Abdominal distension (gaseous): Secondary | ICD-10-CM | POA: Diagnosis not present

## 2020-10-10 DIAGNOSIS — K5190 Ulcerative colitis, unspecified, without complications: Secondary | ICD-10-CM | POA: Diagnosis not present

## 2020-10-10 LAB — CBC AND DIFFERENTIAL
HCT: 35 — AB (ref 36–46)
Hemoglobin: 10.9 — AB (ref 12.0–16.0)
Platelets: 561 — AB (ref 150–399)
WBC: 16.8

## 2020-10-10 LAB — IRON,TIBC AND FERRITIN PANEL
%SAT: 12
Iron: 44
TIBC: 354

## 2020-10-10 LAB — CBC: RBC: 4.07 (ref 3.87–5.11)

## 2020-10-10 NOTE — Telephone Encounter (Signed)
See result note encounter

## 2020-10-19 ENCOUNTER — Telehealth: Payer: Self-pay | Admitting: Family Medicine

## 2020-10-19 NOTE — Telephone Encounter (Signed)
Have you seen these yet? They would've come from her GI doctor.

## 2020-10-19 NOTE — Telephone Encounter (Signed)
I am not sure which labs she is referring to. Labs I ordered on 09/28/20 have already been reviewed and recommendations given.  If GI provider ordered labs, he/she most likely called or sent results to her with recommendations. Thanks, BJ

## 2020-10-19 NOTE — Telephone Encounter (Signed)
Waiting on labs from pt's GI visit.

## 2020-10-19 NOTE — Telephone Encounter (Signed)
Patient called and stated that she had her lab results sent to Dr. Martinique and pt wanted to see if they had been received yet, please advise. CB is [phone]

## 2020-10-31 ENCOUNTER — Encounter: Payer: Self-pay | Admitting: Family Medicine

## 2020-11-15 DIAGNOSIS — M7581 Other shoulder lesions, right shoulder: Secondary | ICD-10-CM | POA: Diagnosis not present

## 2020-11-29 DIAGNOSIS — H524 Presbyopia: Secondary | ICD-10-CM | POA: Diagnosis not present

## 2020-12-23 ENCOUNTER — Other Ambulatory Visit: Payer: Self-pay | Admitting: Family Medicine

## 2020-12-27 DIAGNOSIS — Z01812 Encounter for preprocedural laboratory examination: Secondary | ICD-10-CM | POA: Diagnosis not present

## 2020-12-30 DIAGNOSIS — K5289 Other specified noninfective gastroenteritis and colitis: Secondary | ICD-10-CM | POA: Diagnosis not present

## 2020-12-30 DIAGNOSIS — K5100 Ulcerative (chronic) pancolitis without complications: Secondary | ICD-10-CM | POA: Diagnosis not present

## 2021-01-16 ENCOUNTER — Other Ambulatory Visit: Payer: Self-pay

## 2021-01-16 MED ORDER — HYDROCHLOROTHIAZIDE 25 MG PO TABS: 25.0000 mg | ORAL_TABLET | Freq: Every day | ORAL | 3 refills | Status: DC

## 2021-01-16 MED ORDER — LOSARTAN POTASSIUM 100 MG PO TABS: 100.0000 mg | ORAL_TABLET | Freq: Every day | ORAL | 3 refills | Status: DC

## 2021-03-08 ENCOUNTER — Inpatient Hospital Stay: Payer: BC Managed Care – PPO | Attending: Hematology and Oncology

## 2021-03-08 ENCOUNTER — Other Ambulatory Visit: Payer: BC Managed Care – PPO

## 2021-03-22 ENCOUNTER — Other Ambulatory Visit: Payer: Self-pay

## 2021-03-22 ENCOUNTER — Encounter: Payer: Self-pay | Admitting: Family Medicine

## 2021-03-22 ENCOUNTER — Ambulatory Visit (INDEPENDENT_AMBULATORY_CARE_PROVIDER_SITE_OTHER): Payer: BC Managed Care – PPO | Admitting: Family Medicine

## 2021-03-22 ENCOUNTER — Other Ambulatory Visit: Payer: BC Managed Care – PPO

## 2021-03-22 VITALS — BP 128/80 | HR 79 | Temp 98.2°F | Resp 16 | Ht 67.5 in | Wt 246.0 lb

## 2021-03-22 DIAGNOSIS — D509 Iron deficiency anemia, unspecified: Secondary | ICD-10-CM

## 2021-03-22 DIAGNOSIS — Z130 Encounter for screening for diseases of the blood and blood-forming organs and certain disorders involving the immune mechanism: Secondary | ICD-10-CM

## 2021-03-22 DIAGNOSIS — E785 Hyperlipidemia, unspecified: Secondary | ICD-10-CM

## 2021-03-22 DIAGNOSIS — Z1329 Encounter for screening for other suspected endocrine disorder: Secondary | ICD-10-CM

## 2021-03-22 DIAGNOSIS — I10 Essential (primary) hypertension: Secondary | ICD-10-CM

## 2021-03-22 DIAGNOSIS — Z0000 Encounter for general adult medical examination without abnormal findings: Secondary | ICD-10-CM | POA: Diagnosis not present

## 2021-03-22 DIAGNOSIS — E559 Vitamin D deficiency, unspecified: Secondary | ICD-10-CM

## 2021-03-22 DIAGNOSIS — Z13228 Encounter for screening for other metabolic disorders: Secondary | ICD-10-CM

## 2021-03-22 LAB — BASIC METABOLIC PANEL
BUN: 16 mg/dL (ref 6–23)
CO2: 30 mEq/L (ref 19–32)
Calcium: 9.6 mg/dL (ref 8.4–10.5)
Chloride: 100 mEq/L (ref 96–112)
Creatinine, Ser: 0.8 mg/dL (ref 0.40–1.20)
GFR: 81.41 mL/min (ref >60.00)
Glucose, Bld: 93 mg/dL (ref 70–99)
Potassium: 3.9 mEq/L (ref 3.5–5.1)
Sodium: 138 mEq/L (ref 135–145)

## 2021-03-22 LAB — LIPID PANEL
Cholesterol: 249 mg/dL — ABNORMAL HIGH (ref 0–200)
HDL: 44.5 mg/dL (ref >39.00)
LDL Cholesterol: 178 mg/dL — ABNORMAL HIGH (ref 0–99)
NonHDL: 204.16
Total CHOL/HDL Ratio: 6
Triglycerides: 129 mg/dL (ref 0.0–149.0)
VLDL: 25.8 mg/dL (ref 0.0–40.0)

## 2021-03-22 LAB — CBC WITH DIFFERENTIAL/PLATELET
Basophils Absolute: 0.1 10*3/uL (ref 0.0–0.1)
Basophils Relative: 0.7 % (ref 0.0–3.0)
Eosinophils Absolute: 0.1 10*3/uL (ref 0.0–0.7)
Eosinophils Relative: 0.7 % (ref 0.0–5.0)
HCT: 35.9 % — ABNORMAL LOW (ref 36.0–46.0)
Hemoglobin: 11.7 g/dL — ABNORMAL LOW (ref 12.0–15.0)
Lymphocytes Relative: 26.9 % (ref 12.0–46.0)
Lymphs Abs: 3 10*3/uL (ref 0.7–4.0)
MCHC: 32.6 g/dL (ref 30.0–36.0)
MCV: 85.3 fl (ref 78.0–100.0)
Monocytes Absolute: 0.5 10*3/uL (ref 0.1–1.0)
Monocytes Relative: 4.4 % (ref 3.0–12.0)
Neutro Abs: 7.5 10*3/uL (ref 1.4–7.7)
Neutrophils Relative %: 67.3 % (ref 43.0–77.0)
Platelets: 458 10*3/uL — ABNORMAL HIGH (ref 150.0–400.0)
RBC: 4.21 Mil/uL (ref 3.87–5.11)
RDW: 14.8 % (ref 11.5–15.5)
WBC: 11.2 10*3/uL — ABNORMAL HIGH (ref 4.0–10.5)

## 2021-03-22 LAB — VITAMIN D 25 HYDROXY (VIT D DEFICIENCY, FRACTURES): VITD: 18.84 ng/mL — ABNORMAL LOW (ref 30.00–100.00)

## 2021-03-22 LAB — TSH: TSH: 1.07 u[IU]/mL (ref 0.35–4.50)

## 2021-03-22 NOTE — Progress Notes (Signed)
HPI: Amy Coffey is a 60 y.o. female, who is here today for her routine physical.  Last CPE: 02/24/20  Regular exercise 3 or more time per week: She has not been consistent. Following a healthy diet: She is trying to eat more salads and cooking more at home. Eats out 1-2 times per week.  She lives with her husband.  Chronic medical problems: HLD,HTN,allergies,UC,and anemia among some.  Pap smear:06/03/19. She sees her gyn, Dr Dellis Filbert, periodically.  Immunization History  Administered Date(s) Administered  . Influenza Inj Mdck Quad Pf 09/03/2019   Mammogram: 09/05/20. Colonoscopy: 12/30/20.She follows with GI, Dr Paulita Fujita. DEXA: N/A Hep C screening: 02/03/18 NR  HLD: She is not longer on Crestor 10 mg, it was causing muscle aches.  Lab Results  Component Value Date   CHOL 180 02/24/2020   HDL 44.70 02/24/2020   LDLCALC 116 (H) 02/24/2020   TRIG 94.0 02/24/2020   CHOLHDL 4 02/24/2020   She would like CBC,Vit D, and TSH to be checked. Abnormal CBC/leukocytosis: She did not keep appt with Dr Lindi Adie, 03/08/21. Hx of vit D deficiency, she is not on Vit D supplementation.  HTN on Losartan 100 mg daily and HCTZ 25 mg daily.  Review of Systems  Constitutional: Negative for appetite change, fatigue and fever.  HENT: Negative for dental problem, hearing loss, mouth sores and sore throat.   Eyes: Negative for redness and visual disturbance.  Respiratory: Negative for cough, shortness of breath and wheezing.   Cardiovascular: Negative for chest pain and leg swelling.  Gastrointestinal: Negative for abdominal pain, nausea and vomiting.       No changes in bowel habits.  Endocrine: Negative for cold intolerance, heat intolerance, polydipsia, polyphagia and polyuria.  Genitourinary: Negative for decreased urine volume, dysuria, hematuria, vaginal bleeding and vaginal discharge.  Musculoskeletal: Positive for arthralgias. Negative for gait problem and myalgias.  Skin: Negative for  color change and rash.  Allergic/Immunologic: Positive for environmental allergies.  Neurological: Negative for syncope, weakness and headaches.  Hematological: Negative for adenopathy. Does not bruise/bleed easily.  Psychiatric/Behavioral: Negative for behavioral problems and confusion.  All other systems reviewed and are negative.  Current Outpatient Medications on File Prior to Visit  Medication Sig Dispense Refill  . FeFum-FePoly-FA-B Cmp-C-Biot (FOLIVANE-PLUS) CAPS TAKE 1 CAPSULE BY MOUTH EVERY DAY 90 capsule 2  . hydrochlorothiazide (HYDRODIURIL) 25 MG tablet Take 1 tablet (25 mg total) by mouth daily. 90 tablet 3  . losartan (COZAAR) 100 MG tablet Take 1 tablet (100 mg total) by mouth daily. 90 tablet 3  . mesalamine (APRISO) 0.375 g 24 hr capsule Take 1,500 mg by mouth every morning.     . mometasone (NASONEX) 50 MCG/ACT nasal spray Place 2 sprays into the nose daily. 1 each 4  . Nystatin POWD Apply small amount to affected area 1 Bottle 1   No current facility-administered medications on file prior to visit.   Past Medical History:  Diagnosis Date  . Arthritis    knees  . Heart murmur    per pt had echo done yrs ago was told mild  . History of hyperprolactinemia    02/ 2018  resolved  . Hyperlipidemia   . Hypertension   . Iron deficiency anemia   . Menometrorrhagia    REFRACTORY TO PROGESTIN  . Ulcerative colitis ([place]) followed by [facility]   per pt dx 1990s  . Wears glasses     Past Surgical History:  Procedure Laterality Date  . CESAREAN  SECTION  1988  . ECTOPIC PREGNANCY SURGERY  2002 approx.   right partial salpingectomy via lapartomy  . EXPLORATORY LAPAROTOMY W/ LEFT SALPINGECTOMY FOR ECTOPIC  03-31-2003   dr Lahoma Crocker   No Known Allergies  Family History  Problem Relation Age of Onset  . Angina Mother   . Thyroid disease Mother   . Arthritis Mother   . Heart disease Mother   . Hypertension Mother   . Hyperlipidemia Mother   . Prostate cancer  Father   . Arthritis Father   . Cancer Father        prostate  . Hypertension Father   . Cancer Maternal Aunt        breast  . Breast cancer Maternal Aunt   . Breast cancer Cousin     Social History   Socioeconomic History  . Marital status: Married    Spouse name: [name]  . Number of children: 1  . Years of education: BA  . Highest education level: Not on file  Occupational History  . Occupation: Airline pilot  Tobacco Use  . Smoking status: Former Smoker    Years: 10.00    Types: Cigarettes    Quit date: 12/03/1997    Years since quitting: 23.3  . Smokeless tobacco: Never Used  Vaping Use  . Vaping Use: Never used  Substance and Sexual Activity  . Alcohol use: No  . Drug use: No  . Sexual activity: Yes    Partners: Male    Birth control/protection: I.U.D.    Comment: 1st intercourse- 60, partners- 34, married- 12 yrs  Other Topics Concern  . Not on file  Social History Narrative   Lives with husband   Caffeine use: coffee daily   Pepsi on weekends   Social Determinants of Health   Financial Resource Strain: Not on file  Food Insecurity: Not on file  Transportation Needs: Not on file  Physical Activity: Not on file  Stress: Not on file  Social Connections: Not on file   Vitals:   03/22/21 0727  BP: 128/80  Pulse: 79  Resp: 16  Temp: 98.2 F (36.8 C)  SpO2: 99%   Body mass index is 37.96 kg/m.  Wt Readings from Last 3 Encounters:  03/22/21 246 lb (111.6 kg)  10/06/20 234 lb (106.1 kg)  09/26/20 236 lb (107 kg)   Physical Exam Vitals and nursing note reviewed.  Constitutional:      General: She is not in acute distress.    Appearance: She is well-developed.  HENT:     Head: Normocephalic and atraumatic.     Right Ear: Hearing, tympanic membrane, ear canal and external ear normal.     Left Ear: Hearing, tympanic membrane, ear canal and external ear normal.     Mouth/Throat:     Pharynx: Uvula midline.  Eyes:     Conjunctiva/sclera: Conjunctivae  normal.     Pupils: Pupils are equal, round, and reactive to light.  Neck:     Thyroid: No thyroid mass.     Trachea: No tracheal deviation.  Cardiovascular:     Rate and Rhythm: Normal rate and regular rhythm.     Pulses:          Dorsalis pedis pulses are 2+ on the right side and 2+ on the left side.     Heart sounds: No murmur heard.   Pulmonary:     Effort: Pulmonary effort is normal. No respiratory distress.     Breath sounds: Normal breath  sounds.  Chest:  Breasts:     Right: No supraclavicular adenopathy.     Left: No supraclavicular adenopathy.    Abdominal:     Palpations: Abdomen is soft. There is no hepatomegaly or mass.     Tenderness: There is no abdominal tenderness.  Genitourinary:    Comments: Deferred to gyn. Musculoskeletal:     Comments: No signs of synovitis appreciated.  Lymphadenopathy:     Cervical: No cervical adenopathy.     Upper Body:     Right upper body: No supraclavicular adenopathy.     Left upper body: No supraclavicular adenopathy.  Skin:    General: Skin is warm.     Findings: No erythema or rash.  Neurological:     Mental Status: She is alert and oriented to person, place, and time.     Cranial Nerves: No cranial nerve deficit.     Coordination: Coordination normal.     Gait: Gait normal.     Deep Tendon Reflexes:     Reflex Scores:      Bicep reflexes are 2+ on the right side and 2+ on the left side.      Patellar reflexes are 2+ on the right side and 2+ on the left side. Psychiatric:        Speech: Speech normal.     Comments: Well groomed, good eye contact.   ASSESSMENT AND PLAN:  Amy Coffey was here today annual physical examination.  Orders Placed This Encounter  Procedures  . Basic metabolic panel  . Lipid panel  . TSH  . CBC with Differential/Platelet  . VITAMIN D 25 Hydroxy (Vit-D Deficiency, Fractures)   Lab Results  Component Value Date   CHOL 249 (H) 03/22/2021   HDL 44.50 03/22/2021   LDLCALC 178  (H) 03/22/2021   TRIG 129.0 03/22/2021   CHOLHDL 6 03/22/2021   Lab Results  Component Value Date   TSH 1.07 03/22/2021   Lab Results  Component Value Date   WBC 11.2 (H) 03/22/2021   HGB 11.7 (L) 03/22/2021   HCT 35.9 (L) 03/22/2021   MCV 85.3 03/22/2021   PLT 458.0 (H) 03/22/2021   Lab Results  Component Value Date   CREATININE 0.80 03/22/2021   BUN 16 03/22/2021   NA 138 03/22/2021   K 3.9 03/22/2021   CL 100 03/22/2021   CO2 30 03/22/2021    Routine general medical examination at a health care facility We discussed the importance of regular physical activity and healthy diet for prevention of chronic illness and/or complications. Preventive guidelines reviewed. Vaccination up to date. Continue female preventive care with her gyn.  Ca++ and vit D supplementation recommended. Next CPE in a year.  The 10-year ASCVD risk score Mikey Bussing DC Brooke Bonito., et al., 2013) is: 8.5%   Values used to calculate the score:     Age: 7 years     Sex: Female     Is Non-Hispanic African American: Yes     Diabetic: No     Tobacco smoker: No     Systolic Blood Pressure: 829 mmHg     Is BP treated: Yes     HDL Cholesterol: 44.5 mg/dL     Total Cholesterol: 249 mg/dL  Hyperlipidemia, unspecified hyperlipidemia type Side effects with statins. Continue non pharmacologic treatment. Further recommendations according to lab results.  Hypertension, essential BP adequately controlled. No changes in current management.  Iron deficiency anemia, unspecified iron deficiency anemia type She will re-schedule appt with  Dr Lindi Adie.  Screening for endocrine, metabolic and immunity disorder -     Basic metabolic panel  Vitamin D deficiency, unspecified Further recommendations according to 25 OH vit D result.   Return in 6 months (on 09/21/2021) for HTN.   Shakesha Soltau G. Martinique, MD  [facility]. [place] office.  Today you have you routine preventive visit. A few things to remember from  today's visit:  Routine general medical examination at a health care facility  Hyperlipidemia, unspecified hyperlipidemia type - Plan: Lipid panel  Hypertension, essential - Plan: TSH  Iron deficiency anemia, unspecified iron deficiency anemia type - Plan: CBC with Differential/Platelet  Screening for endocrine, metabolic and immunity disorder - Plan: Basic metabolic panel  If you need refills please call your pharmacy. Do not use My Chart to request refills or for acute issues that need immediate attention.    Please be sure medication list is accurate. If a new problem present, please set up appointment sooner than planned today.  At least 150 minutes of moderate exercise per week, daily brisk walking for 15-30 min is a good exercise option. Healthy diet low in saturated (animal) fats and sweets and consisting of fresh fruits and vegetables, lean meats such as fish and white chicken and whole grains.  These are some of recommendations for screening depending of age and risk factors:  - Vaccines:  Tdap vaccine every 10 years.  Shingles vaccine recommended at age 8, could be given after 58 years of age but not sure about insurance coverage.   Pneumonia vaccines: Pneumovax at 63. Sometimes Pneumovax is giving earlier if history of smoking, lung disease,diabetes,kidney disease among some.  Screening for diabetes at age 27 and every 3 years.  Cervical cancer prevention:  Pap smear starts at 58 years of age and continues periodically until 58 years old in low risk women. Pap smear every 3 years between 22 and 42 years old. Pap smear every 3-5 years between women 66 and older if pap smear negative and HPV screening negative.   -Breast cancer: Mammogram: There is disagreement between experts about when to start screening in low risk asymptomatic female but recent recommendations are to start screening at 103 and not later than 58 years old , every 1-2 years and after 58 yo q 2  years. Screening is recommended until 58 years old but some women can continue screening depending of healthy issues.  Colon cancer screening: Has been recently changed to 58 yo. Insurance may not cover until you are 58 years old. Screening is recommended until 58 years old.  Cholesterol disorder screening at age 65 and every 3 years.N/A  Also recommended:  1. Dental visit- Brush and floss your teeth twice daily; visit your dentist twice a year. 2. Eye doctor- Get an eye exam at least every 2 years. 3. Helmet use- Always wear a helmet when riding a bicycle, motorcycle, rollerblading or skateboarding. 4. Safe sex- If you may be exposed to sexually transmitted infections, use a condom. 5. Seat belts- Seat belts can save your live; always wear one. 6. Smoke/Carbon Monoxide detectors- These detectors need to be installed on the appropriate level of your home. Replace batteries at least once a year. 7. Skin cancer- When out in the sun please cover up and use sunscreen 15 SPF or higher. 8. Violence- If anyone is threatening or hurting you, please tell your healthcare provider.  9. Drink alcohol in moderation- Limit alcohol intake to one drink or less per day. Never drink  and drive. 10. Calcium supplementation 1000 to 1200 mg daily, ideally through your diet.  Vitamin D supplementation 800 units daily.

## 2021-03-22 NOTE — Patient Instructions (Addendum)
Today you have you routine preventive visit. A few things to remember from today's visit:  Routine general medical examination at a health care facility  Hyperlipidemia, unspecified hyperlipidemia type - Plan: Lipid panel  Hypertension, essential - Plan: TSH  Iron deficiency anemia, unspecified iron deficiency anemia type - Plan: CBC with Differential/Platelet  Screening for endocrine, metabolic and immunity disorder - Plan: Basic metabolic panel  If you need refills please call your pharmacy. Do not use My Chart to request refills or for acute issues that need immediate attention.    Please be sure medication list is accurate. If a new problem present, please set up appointment sooner than planned today.  At least 150 minutes of moderate exercise per week, daily brisk walking for 15-30 min is a good exercise option. Healthy diet low in saturated (animal) fats and sweets and consisting of fresh fruits and vegetables, lean meats such as fish and white chicken and whole grains.  These are some of recommendations for screening depending of age and risk factors:  - Vaccines:  Tdap vaccine every 10 years.  Shingles vaccine recommended at age 41, could be given after 58 years of age but not sure about insurance coverage.   Pneumonia vaccines: Pneumovax at 51. Sometimes Pneumovax is giving earlier if history of smoking, lung disease,diabetes,kidney disease among some.  Screening for diabetes at age 75 and every 3 years.  Cervical cancer prevention:  Pap smear starts at 58 years of age and continues periodically until 58 years old in low risk women. Pap smear every 3 years between 38 and 71 years old. Pap smear every 3-5 years between women 64 and older if pap smear negative and HPV screening negative.   -Breast cancer: Mammogram: There is disagreement between experts about when to start screening in low risk asymptomatic female but recent recommendations are to start screening at 35  and not later than 58 years old , every 1-2 years and after 58 yo q 2 years. Screening is recommended until 58 years old but some women can continue screening depending of healthy issues.  Colon cancer screening: Has been recently changed to 58 yo. Insurance may not cover until you are 58 years old. Screening is recommended until 58 years old.  Cholesterol disorder screening at age 68 and every 3 years.N/A  Also recommended:  1. Dental visit- Brush and floss your teeth twice daily; visit your dentist twice a year. 2. Eye doctor- Get an eye exam at least every 2 years. 3. Helmet use- Always wear a helmet when riding a bicycle, motorcycle, rollerblading or skateboarding. 4. Safe sex- If you may be exposed to sexually transmitted infections, use a condom. 5. Seat belts- Seat belts can save your live; always wear one. 6. Smoke/Carbon Monoxide detectors- These detectors need to be installed on the appropriate level of your home. Replace batteries at least once a year. 7. Skin cancer- When out in the sun please cover up and use sunscreen 15 SPF or higher. 8. Violence- If anyone is threatening or hurting you, please tell your healthcare provider.  9. Drink alcohol in moderation- Limit alcohol intake to one drink or less per day. Never drink and drive. 10. Calcium supplementation 1000 to 1200 mg daily, ideally through your diet.  Vitamin D supplementation 800 units daily.

## 2021-03-22 NOTE — Progress Notes (Signed)
Patient scheduled 09/22/2021 at 3 PM

## 2021-03-30 ENCOUNTER — Other Ambulatory Visit: Payer: Self-pay | Admitting: Family Medicine

## 2021-03-30 ENCOUNTER — Telehealth: Payer: Self-pay | Admitting: Family Medicine

## 2021-03-30 DIAGNOSIS — E785 Hyperlipidemia, unspecified: Secondary | ICD-10-CM

## 2021-03-30 DIAGNOSIS — I10 Essential (primary) hypertension: Secondary | ICD-10-CM

## 2021-03-30 NOTE — Telephone Encounter (Signed)
Pt need a refill on medication Losartan Potassium-HCTZ 100-25 MG TAKE 1 TABLET BY MOUTH EVERY DAY IN THE MORNING [id number] [place],  - [id number] AT [place]   Phone:  [phone]  Fax:  [phone]

## 2021-03-31 MED ORDER — LOSARTAN POTASSIUM-HCTZ 100-25 MG PO TABS: 1.0000 | ORAL_TABLET | Freq: Every day | ORAL | 3 refills | Status: DC

## 2021-03-31 NOTE — Telephone Encounter (Signed)
Rx sent 

## 2021-08-18 ENCOUNTER — Other Ambulatory Visit: Payer: Self-pay | Admitting: Family Medicine

## 2021-08-18 DIAGNOSIS — Z1231 Encounter for screening mammogram for malignant neoplasm of breast: Secondary | ICD-10-CM

## 2021-09-19 ENCOUNTER — Other Ambulatory Visit: Payer: Self-pay | Admitting: *Deleted

## 2021-09-19 ENCOUNTER — Other Ambulatory Visit: Payer: Self-pay

## 2021-09-19 ENCOUNTER — Inpatient Hospital Stay: Payer: BC Managed Care – PPO | Attending: Hematology and Oncology

## 2021-09-19 DIAGNOSIS — D649 Anemia, unspecified: Secondary | ICD-10-CM

## 2021-09-22 ENCOUNTER — Other Ambulatory Visit: Payer: Self-pay

## 2021-09-22 ENCOUNTER — Ambulatory Visit: Payer: BC Managed Care – PPO | Admitting: Family Medicine

## 2021-09-22 ENCOUNTER — Ambulatory Visit
Admission: RE | Admit: 2021-09-22 | Discharge: 2021-09-22 | Disposition: A | Discharge status: Home / Self Care | Payer: BC Managed Care – PPO | Source: Ambulatory Visit | Attending: Family Medicine | Admitting: Family Medicine

## 2021-09-22 DIAGNOSIS — Z1231 Encounter for screening mammogram for malignant neoplasm of breast: Secondary | ICD-10-CM | POA: Diagnosis not present

## 2021-09-28 ENCOUNTER — Inpatient Hospital Stay: Payer: BC Managed Care – PPO | Admitting: Hematology and Oncology

## 2021-10-09 ENCOUNTER — Encounter: Payer: Self-pay | Admitting: Family Medicine

## 2021-10-09 ENCOUNTER — Other Ambulatory Visit: Payer: Self-pay | Admitting: *Deleted

## 2021-10-09 ENCOUNTER — Ambulatory Visit: Payer: BC Managed Care – PPO | Admitting: Family Medicine

## 2021-10-09 ENCOUNTER — Inpatient Hospital Stay: Payer: BC Managed Care – PPO | Attending: Hematology and Oncology

## 2021-10-09 ENCOUNTER — Other Ambulatory Visit: Payer: Self-pay

## 2021-10-09 VITALS — BP 126/80 | HR 86 | Resp 16 | Ht 67.5 in | Wt 242.0 lb

## 2021-10-09 DIAGNOSIS — E559 Vitamin D deficiency, unspecified: Secondary | ICD-10-CM

## 2021-10-09 DIAGNOSIS — M25541 Pain in joints of right hand: Secondary | ICD-10-CM

## 2021-10-09 DIAGNOSIS — D649 Anemia, unspecified: Secondary | ICD-10-CM

## 2021-10-09 DIAGNOSIS — D751 Secondary polycythemia: Secondary | ICD-10-CM | POA: Insufficient documentation

## 2021-10-09 DIAGNOSIS — E785 Hyperlipidemia, unspecified: Secondary | ICD-10-CM | POA: Diagnosis not present

## 2021-10-09 DIAGNOSIS — D509 Iron deficiency anemia, unspecified: Secondary | ICD-10-CM | POA: Insufficient documentation

## 2021-10-09 DIAGNOSIS — E876 Hypokalemia: Secondary | ICD-10-CM

## 2021-10-09 DIAGNOSIS — I10 Essential (primary) hypertension: Secondary | ICD-10-CM

## 2021-10-09 DIAGNOSIS — R748 Abnormal levels of other serum enzymes: Secondary | ICD-10-CM

## 2021-10-09 DIAGNOSIS — Z6837 Body mass index (BMI) 37.0-37.9, adult: Secondary | ICD-10-CM

## 2021-10-09 DIAGNOSIS — M25542 Pain in joints of left hand: Secondary | ICD-10-CM

## 2021-10-09 DIAGNOSIS — H6121 Impacted cerumen, right ear: Secondary | ICD-10-CM

## 2021-10-09 DIAGNOSIS — K5190 Ulcerative colitis, unspecified, without complications: Secondary | ICD-10-CM

## 2021-10-09 LAB — CBC WITH DIFFERENTIAL (CANCER CENTER ONLY)
Abs Immature Granulocytes: 0.05 10*3/uL (ref 0.00–0.07)
Basophils Absolute: 0.1 10*3/uL (ref 0.0–0.1)
Basophils Relative: 1 %
Eosinophils Absolute: 0.1 10*3/uL (ref 0.0–0.5)
Eosinophils Relative: 1 %
HCT: 33.7 % — ABNORMAL LOW (ref 36.0–46.0)
Hemoglobin: 11.1 g/dL — ABNORMAL LOW (ref 12.0–15.0)
Immature Granulocytes: 0 %
Lymphocytes Relative: 31 %
Lymphs Abs: 3.7 10*3/uL (ref 0.7–4.0)
MCH: 27.8 pg (ref 26.0–34.0)
MCHC: 32.9 g/dL (ref 30.0–36.0)
MCV: 84.5 fL (ref 80.0–100.0)
Monocytes Absolute: 0.6 10*3/uL (ref 0.1–1.0)
Monocytes Relative: 5 %
Neutro Abs: 7.7 10*3/uL (ref 1.7–7.7)
Neutrophils Relative %: 62 %
Platelet Count: 472 10*3/uL — ABNORMAL HIGH (ref 150–400)
RBC: 3.99 MIL/uL (ref 3.87–5.11)
RDW: 14 % (ref 11.5–15.5)
WBC Count: 12.2 10*3/uL — ABNORMAL HIGH (ref 4.0–10.5)
nRBC: 0 % (ref 0.0–0.2)

## 2021-10-09 LAB — CMP (CANCER CENTER ONLY)
ALT: 25 U/L (ref 0–44)
AST: 19 U/L (ref 15–41)
Albumin: 3.8 g/dL (ref 3.5–5.0)
Alkaline Phosphatase: 152 U/L — ABNORMAL HIGH (ref 38–126)
Anion gap: 7 (ref 5–15)
BUN: 11 mg/dL (ref 6–20)
CO2: 27 mmol/L (ref 22–32)
Calcium: 8.9 mg/dL (ref 8.9–10.3)
Chloride: 105 mmol/L (ref 98–111)
Creatinine: 0.81 mg/dL (ref 0.44–1.00)
GFR, Estimated: >60 mL/min (ref >60)
Glucose, Bld: 97 mg/dL (ref 70–99)
Potassium: 3.4 mmol/L — ABNORMAL LOW (ref 3.5–5.1)
Sodium: 139 mmol/L (ref 135–145)
Total Bilirubin: 0.7 mg/dL (ref 0.3–1.2)
Total Protein: 8 g/dL (ref 6.5–8.1)

## 2021-10-09 LAB — FERRITIN: Ferritin: 294 ng/mL (ref 11–307)

## 2021-10-09 LAB — IRON AND TIBC
Iron: 122 ug/dL (ref 41–142)
Saturation Ratios: 43 % (ref 21–57)
TIBC: 284 ug/dL (ref 236–444)
UIBC: 162 ug/dL (ref 120–384)

## 2021-10-09 MED ORDER — DEBROX 6.5 % OT SOLN: 5.0000 [drp] | Freq: Two times a day (BID) | OTIC | 0 refills | Status: DC

## 2021-10-09 NOTE — Assessment & Plan Note (Signed)
We discussed benefits of wt loss as well as adverse effects of obesity. Consistency with healthy diet and physical activity recommended. Daily brisk walking for 15-30 min as tolerated.

## 2021-10-09 NOTE — Assessment & Plan Note (Signed)
She did not tolerate daily vit D3. We can consider weekly Ergocalciferol according to 25 OH vit D results.

## 2021-10-09 NOTE — Assessment & Plan Note (Signed)
Non pharmacologic treatment recommended for now. Further recommendations will be given according to 10 years CVD risk score and lipid panel numbers. 

## 2021-10-09 NOTE — Assessment & Plan Note (Signed)
Stable. Following with GI.

## 2021-10-09 NOTE — Progress Notes (Signed)
[name] is a 59 y.o.female, who is here today to follow on HTN.  Last follow up visit: 03/22/21 She has followed with hematologist since her last visit and had blood work today. She has also followed with her GI for IBD.  Hypertension:  Medications:Losartan-HCTZ 100-25 mg  BP readings at home:Not checking. Side effects:None. Negative for unusual or severe headache, visual changes, exertional chest pain, dyspnea,  focal weakness, or edema.  Lab Results  Component Value Date   CREATININE 0.81 10/09/2021   BUN 11 10/09/2021   NA 139 10/09/2021   K 3.4 (L) 10/09/2021   CL 105 10/09/2021   CO2 27 10/09/2021   Vit D deficiency: She is not on Vit D supplementation, daily OTC caused fatigue. It resolved when discontinued. 25 OH vit D 18 in 03/2021.  Hyperlipidemia: Currently on Flax seed 1 cap daily. Following a low fat diet: Yes..  Lab Results  Component Value Date   CHOL 249 (H) 03/22/2021   HDL 44.50 03/22/2021   LDLCALC 178 (H) 03/22/2021   TRIG 129.0 03/22/2021   CHOLHDL 6 03/22/2021   Alkaline phosphate has been elevated before. Lab Results  Component Value Date   ALT 25 10/09/2021   AST 19 10/09/2021   ALKPHOS 152 (H) 10/09/2021   BILITOT 0.7 10/09/2021   Right ear fullness sensation, sometimes. No pain or discharge. No recent URI or travel. She has not used OTC medications.  IP joint pain for a few weeks. She is working longer hours, hand repetitive movement. No edema or erythema. No limitation of ROM. Right handed.  Review of Systems  Constitutional:  Negative for chills and fever.  HENT:  Negative for hearing loss, mouth sores and sore throat.   Respiratory:  Negative for cough and wheezing.   Musculoskeletal:  Negative for gait problem and myalgias.  Skin:  Negative for rash.  Neurological:  Negative for syncope and facial asymmetry.  Psychiatric/Behavioral:  Negative for confusion. The patient is not nervous/anxious.   Rest see  pertinent positives and negatives per HPI.  Current Outpatient Medications on File Prior to Visit  Medication Sig Dispense Refill   FeFum-FePoly-FA-B Cmp-C-Biot (FOLIVANE-PLUS) CAPS TAKE 1 CAPSULE BY MOUTH EVERY DAY 90 capsule 2   losartan-hydrochlorothiazide (HYZAAR) 100-25 MG tablet Take 1 tablet by mouth daily. 90 tablet 3   mesalamine (APRISO) 0.375 g 24 hr capsule Take 1,500 mg by mouth every morning.      mometasone (NASONEX) 50 MCG/ACT nasal spray Place 2 sprays into the nose daily. 1 each 4   Nystatin POWD Apply small amount to affected area 1 Bottle 1   No current facility-administered medications on file prior to visit.   Past Medical History:  Diagnosis Date   Arthritis    knees   Heart murmur    per pt had echo done yrs ago was told mild   History of hyperprolactinemia    02/ 2018  resolved   Hyperlipidemia    Hypertension    Iron deficiency anemia    Menometrorrhagia    REFRACTORY TO PROGESTIN   Ulcerative colitis ([place]) followed by [facility]   per pt dx 1990s   Wears glasses    No Known Allergies  Social History   Socioeconomic History   Marital status: Married    Spouse name: [name]   Number of children: 1   Years of education: BA   Highest education level: Not on file  Occupational History   Occupation: Airline pilot  Tobacco  Use   Smoking status: Former    Years: 10.00    Types: Cigarettes    Quit date: 12/03/1997    Years since quitting: 23.8   Smokeless tobacco: Never  Vaping Use   Vaping Use: Never used  Substance and Sexual Activity   Alcohol use: No   Drug use: No   Sexual activity: Yes    Partners: Male    Birth control/protection: I.U.D.    Comment: 1st intercourse- 36, partners- 23, married- 25 yrs  Other Topics Concern   Not on file  Social History Narrative   Lives with husband   Caffeine use: coffee daily   Pepsi on weekends   Social Determinants of Health   Financial Resource Strain: Not on file  Food Insecurity: Not on file   Transportation Needs: Not on file  Physical Activity: Not on file  Stress: Not on file  Social Connections: Not on file   Vitals:   10/09/21 1519  BP: 126/80  Pulse: 86  Resp: 16  SpO2: 98%   Body mass index is 37.34 kg/m.  Physical Exam Vitals and nursing note reviewed.  Constitutional:      General: She is not in acute distress.    Appearance: She is well-developed.  HENT:     Head: Normocephalic and atraumatic.     Right Ear: External ear normal.     Left Ear: Tympanic membrane, ear canal and external ear normal.     Ears:     Comments: Cerumen excess right ear, not impacted.    Mouth/Throat:     Mouth: Mucous membranes are moist.     Pharynx: Oropharynx is clear.  Eyes:     Conjunctiva/sclera: Conjunctivae normal.  Cardiovascular:     Rate and Rhythm: Normal rate and regular rhythm.     Pulses:          Dorsalis pedis pulses are 2+ on the right side and 2+ on the left side.     Heart sounds: No murmur heard. Pulmonary:     Effort: Pulmonary effort is normal. No respiratory distress.     Breath sounds: Normal breath sounds.  Abdominal:     Palpations: Abdomen is soft. There is no hepatomegaly or mass.     Tenderness: There is no abdominal tenderness.  Musculoskeletal:     Comments: No signs of synovitis or significant abnormalities.   Lymphadenopathy:     Cervical: No cervical adenopathy.  Skin:    General: Skin is warm.     Findings: No erythema or rash.  Neurological:     General: No focal deficit present.     Mental Status: She is alert and oriented to person, place, and time.     Cranial Nerves: No cranial nerve deficit.     Gait: Gait normal.  Psychiatric:     Comments: Well groomed, good eye contact.   ASSESSMENT AND PLAN:   [name] was seen today for follow-up.  Diagnoses and all orders for this visit: Orders Placed This Encounter  Procedures   Lipid panel   VITAMIN D 25 Hydroxy (Vit-D Deficiency, Fractures)   Gamma GT   Lab Results   Component Value Date   CHOL 254 (H) 10/09/2021   HDL 41.90 10/09/2021   LDLCALC 184 (H) 10/09/2021   TRIG 136.0 10/09/2021   CHOLHDL 6 10/09/2021   The 10-year ASCVD risk score (Arnett DK, et al., 2019) is: 8.6%   Values used to calculate the score:     Age:  51 years     Sex: Female     Is Non-Hispanic African American: Yes     Diabetic: No     Tobacco smoker: No     Systolic Blood Pressure: 539 mmHg     Is BP treated: Yes     HDL Cholesterol: 41.9 mg/dL     Total Cholesterol: 254 mg/dL  Arthralgia of both hands Most likely OA. Educated about Dx,prognosis,and treatment options. Acetaminophen 500 mg q 6 hours prn.   Alkaline phosphatase elevation Mild. We discussed possible causes. GGT added to labs today.  Excessive cerumen in right ear canal Recommend Debrox a few times per week. Avoid Q tips.  -     carbamide peroxide (DEBROX) 6.5 % OTIC solution; Place 5 drops into the left ear 2 (two) times daily.  Hypokalemia Mild. K+ rich diet recommended. Some side effects of HCTZ discussed.  Hypertension, essential BP adequately controlled. Continue current management: Losartan-HCTZ same dose. DASH/low salt diet to continue. Monitor BP at home. Eye exam is current.  Class 2 obesity with body mass index (BMI) of 37.0 to 37.9 in adult We discussed benefits of wt loss as well as adverse effects of obesity. Consistency with healthy diet and physical activity recommended. Daily brisk walking for 15-30 min as tolerated.   Vitamin D deficiency, unspecified She did not tolerate daily vit D3. We can consider weekly Ergocalciferol according to 25 OH vit D results.  Ulcerative colitis ([place]) Stable. Following with GI.  Hyperlipemia Non pharmacologic treatment recommended for now. Further recommendations will be given according to 10 years CVD risk score and lipid panel numbers.  Return in about 6 months (around 04/08/2022) for cpe.  Cleveland Paiz G. Martinique, MD  [facility]. [place] office.

## 2021-10-09 NOTE — Patient Instructions (Addendum)
A few things to remember from today's visit:   Alkaline phosphatase elevation - Plan: Gamma GT  Ulcerative colitis without complications, unspecified location ([place]), Chronic  Hypertension, essential  Vitamin D deficiency, unspecified - Plan: VITAMIN D 25 Hydroxy (Vit-D Deficiency, Fractures)  Excessive cerumen in right ear canal - Plan: carbamide peroxide (DEBROX) 6.5 % OTIC solution  Hyperlipidemia, unspecified hyperlipidemia type - Plan: Lipid panel  Hypokalemia  Arthralgia of both hands  If you need refills please call your pharmacy. Do not use My Chart to request refills or for acute issues that need immediate attention.   No changes in medications today. Tylenol for joint pain, most likely arthritis. Walk 10-15 min daily.  Please be sure medication list is accurate. If a new problem present, please set up appointment sooner than planned today.

## 2021-10-09 NOTE — Assessment & Plan Note (Signed)
BP adequately controlled. Continue current management: Losartan-HCTZ same dose. DASH/low salt diet to continue. Monitor BP at home. Eye exam is current.

## 2021-10-10 LAB — LIPID PANEL
Cholesterol: 254 mg/dL — ABNORMAL HIGH (ref 0–200)
HDL: 41.9 mg/dL (ref >39.00)
LDL Cholesterol: 184 mg/dL — ABNORMAL HIGH (ref 0–99)
NonHDL: 211.68
Total CHOL/HDL Ratio: 6
Triglycerides: 136 mg/dL (ref 0.0–149.0)
VLDL: 27.2 mg/dL (ref 0.0–40.0)

## 2021-10-10 LAB — VITAMIN D 25 HYDROXY (VIT D DEFICIENCY, FRACTURES): VITD: 25.69 ng/mL — ABNORMAL LOW (ref 30.00–100.00)

## 2021-10-10 LAB — GAMMA GT: GGT: 128 U/L — ABNORMAL HIGH (ref 7–51)

## 2021-10-12 ENCOUNTER — Encounter: Payer: Self-pay | Admitting: Family Medicine

## 2021-10-12 MED ORDER — VITAMIN D (ERGOCALCIFEROL) 1.25 MG (50000 UNIT) PO CAPS: 50000.0000 [IU] | ORAL_CAPSULE | ORAL | 2 refills | Status: DC

## 2021-10-14 ENCOUNTER — Encounter: Payer: Self-pay | Admitting: Family Medicine

## 2021-10-16 ENCOUNTER — Other Ambulatory Visit: Payer: Self-pay

## 2021-10-16 DIAGNOSIS — E785 Hyperlipidemia, unspecified: Secondary | ICD-10-CM

## 2021-10-16 DIAGNOSIS — R748 Abnormal levels of other serum enzymes: Secondary | ICD-10-CM

## 2021-10-16 MED ORDER — ROSUVASTATIN CALCIUM 10 MG PO TABS: 10.0000 mg | ORAL_TABLET | Freq: Every day | ORAL | 3 refills | Status: DC

## 2021-10-16 NOTE — Progress Notes (Signed)
  HEMATOLOGY-ONCOLOGY TELEPHONE VISIT PROGRESS NOTE  I connected with Amy Coffey on 10/17/2021 at 11:45 AM EST by telephone and verified that I am speaking with the correct person using two identifiers.  I discussed the limitations, risks, security and privacy concerns of performing an evaluation and management service by telephone and the availability of in person appointments.  I also discussed with the patient that there may be a patient responsible charge related to this service. The patient expressed understanding and agreed to proceed.   History of Present Illness: Amy Coffey is a 60 y.o. female with above-mentioned history of anemia and thrombocytosis. Labs on 10/09/2021 showed Hg 11.1, HCT 33.7, platelets 472, ferritin 294. She presents via telephone today for follow-up.  She is tolerating oral iron fairly well without any problems or concerns.  Thrombocytosis is felt to be related to underlying colitis.  Labs do reveal vitamin D deficiency and she is on vitamin D supplementation.  Observations/Objective:  No pain or discomfort   Assessment Plan:  Normocytic anemia Lab review  02/24/2020: Hemoglobin 11.2, MCV 85, RDW 15.8, platelets 463 10/09/2021: Hemoglobin 11.1, MCV 84.5, platelets 472, WBC 12.2, ferritin 294, iron saturation 43%, alk phos 152  I discussed with her that these labs indicate stable anemia. Previous work-up ruled out hemolysis, B12 or iron deficiencies. Hemoglobin electrophoresis: Sickle cell trait with hemoglobin SS: 33%   Thrombocytosis: Relatively steady: Acute phase reactant most likely. Could be coming from colitis or arthritis  Return to clinic in 1 year for follow-up and we will review the blood work done by her primary care physician    I discussed the assessment and treatment plan with the patient. The patient was provided an opportunity to ask questions and all were answered. The patient agreed with the plan and demonstrated an understanding of  the instructions. The patient was advised to call back or seek an in-person evaluation if the symptoms worsen or if the condition fails to improve as anticipated.   Total time spent: 20 mins including non-face to face time and time spent for planning, charting and coordination of care  Rulon Eisenmenger, MD 10/17/2021    I, Thana Ates, am acting as scribe for Nicholas Lose, MD.  I have reviewed the above documentation for accuracy and completeness, and I agree with the above.

## 2021-10-17 ENCOUNTER — Other Ambulatory Visit: Payer: Self-pay

## 2021-10-17 ENCOUNTER — Inpatient Hospital Stay (HOSPITAL_BASED_OUTPATIENT_CLINIC_OR_DEPARTMENT_OTHER): Payer: BC Managed Care – PPO | Admitting: Hematology and Oncology

## 2021-10-17 DIAGNOSIS — D649 Anemia, unspecified: Secondary | ICD-10-CM | POA: Diagnosis not present

## 2021-10-17 NOTE — Assessment & Plan Note (Signed)
Lab review  02/24/2020: Hemoglobin 11.2, MCV 85, RDW 15.8, platelets 463 10/09/2021: Hemoglobin 11.1, MCV 84.5, platelets 472, WBC 12.2, ferritin 294, iron saturation 43%, alk phos 152  I discussed with her that these labs indicate stable anemia. Previous work-up ruled out hemolysis, B12 or iron deficiencies. Hemoglobin electrophoresis: Sickle cell trait with hemoglobin SS: 33%  Thrombocytosis: Relatively steady: Acute phase reactant most likely. Could be coming from colitis or arthritis  Return to clinic in 1 year for follow-up

## 2021-10-23 ENCOUNTER — Ambulatory Visit: Payer: BC Managed Care – PPO | Admitting: Obstetrics & Gynecology

## 2021-10-31 ENCOUNTER — Telehealth: Payer: Self-pay | Admitting: Hematology and Oncology

## 2021-10-31 NOTE — Telephone Encounter (Signed)
Sch per 11/15 los, pt aware 

## 2021-11-08 ENCOUNTER — Other Ambulatory Visit (HOSPITAL_COMMUNITY)
Admission: RE | Admit: 2021-11-08 | Discharge: 2021-11-08 | Disposition: A | Discharge status: Home / Self Care | Payer: BC Managed Care – PPO | Source: Ambulatory Visit | Attending: Obstetrics & Gynecology | Admitting: Obstetrics & Gynecology

## 2021-11-08 ENCOUNTER — Encounter: Payer: Self-pay | Admitting: Obstetrics & Gynecology

## 2021-11-08 ENCOUNTER — Ambulatory Visit (INDEPENDENT_AMBULATORY_CARE_PROVIDER_SITE_OTHER): Payer: BC Managed Care – PPO | Admitting: Obstetrics & Gynecology

## 2021-11-08 ENCOUNTER — Other Ambulatory Visit: Payer: Self-pay

## 2021-11-08 VITALS — BP 118/80 | HR 96 | Resp 16 | Ht 67.5 in | Wt 241.0 lb

## 2021-11-08 DIAGNOSIS — Z6837 Body mass index (BMI) 37.0-37.9, adult: Secondary | ICD-10-CM

## 2021-11-08 DIAGNOSIS — R61 Generalized hyperhidrosis: Secondary | ICD-10-CM

## 2021-11-08 DIAGNOSIS — Z30431 Encounter for routine checking of intrauterine contraceptive device: Secondary | ICD-10-CM

## 2021-11-08 DIAGNOSIS — Z01419 Encounter for gynecological examination (general) (routine) without abnormal findings: Secondary | ICD-10-CM | POA: Insufficient documentation

## 2021-11-08 LAB — FOLLICLE STIMULATING HORMONE: FSH: 44 m[IU]/mL

## 2021-11-08 NOTE — Progress Notes (Signed)
Amy Coffey September 08, 1961 [id number]   History:    60 y.o. G5P1A4L1 Married   RP:  Established patient presenting for annual gyn exam    HPI: Well on Mirena IUD x 08/2017.  Minimal BTB x1, 2 weeks ago. Feeling warm at night.  No hot flushes.  No pelvic pain.  No pain with IC.  Pap 06/2019 Neg. Urine normal. Bowel movements normal. Breasts normal.  Screening mammo 09/2021 Neg. Body mass index 37.19.  Not exercising regularly.  Health labs with family physician. COLONOSCOPY: 12-30-20  Past medical history,surgical history, family history and social history were all reviewed and documented in the EPIC chart.  Gynecologic History Patient's last menstrual period was 07/20/2017.  Obstetric History OB History  Gravida Para Term Preterm AB Living  5 1     4 1   SAB IAB Ectopic Multiple Live Births      4        # Outcome Date GA Lbr Len/2nd Weight Sex Delivery Anes PTL Lv  5 Ectopic           4 Ectopic           3 Ectopic           2 Ectopic           1 Para              ROS: A ROS was performed and pertinent positives and negatives are included in the history.  GENERAL: No fevers or chills. HEENT: No change in vision, no earache, sore throat or sinus congestion. NECK: No pain or stiffness. CARDIOVASCULAR: No chest pain or pressure. No palpitations. PULMONARY: No shortness of breath, cough or wheeze. GASTROINTESTINAL: No abdominal pain, nausea, vomiting or diarrhea, melena or bright red blood per rectum. GENITOURINARY: No urinary frequency, urgency, hesitancy or dysuria. MUSCULOSKELETAL: No joint or muscle pain, no back pain, no recent trauma. DERMATOLOGIC: No rash, no itching, no lesions. ENDOCRINE: No polyuria, polydipsia, no heat or cold intolerance. No recent change in weight. HEMATOLOGICAL: No anemia or easy bruising or bleeding. NEUROLOGIC: No headache, seizures, numbness, tingling or weakness. PSYCHIATRIC: No depression, no loss of interest in normal activity or change in sleep pattern.      Exam:   BP 118/80   Pulse 96   Resp 16   Ht 5' 7.5" (1.715 m)   Wt 241 lb (109.3 kg)   LMP 07/20/2017 Comment: mirena inserted 08-13-17  BMI 37.19 kg/m   Body mass index is 37.19 kg/m.  General appearance : Well developed well nourished female. No acute distress HEENT: Eyes: no retinal hemorrhage or exudates,  Neck supple, trachea midline, no carotid bruits, no thyroidmegaly Lungs: Clear to auscultation, no rhonchi or wheezes, or rib retractions  Heart: Regular rate and rhythm, no murmurs or gallops Breast:Examined in sitting and supine position were symmetrical in appearance, no palpable masses or tenderness,  no skin retraction, no nipple inversion, no nipple discharge, no skin discoloration, no axillary or supraclavicular lymphadenopathy Abdomen: no palpable masses or tenderness, no rebound or guarding Extremities: no edema or skin discoloration or tenderness  Pelvic: Vulva: Normal             Vagina: No gross lesions or discharge  Cervix: No gross lesions or discharge.  IUD strings visible at [facility].  Pap reflex done.  Uterus  AV, normal size, shape and consistency, non-tender and mobile  Adnexa  Without masses or tenderness  Anus: Normal   Assessment/Plan:  60 y.o. female for  annual exam   1. Encounter for routine gynecological examination with Papanicolaou smear of cervix Well on Mirena IUD x 08/2017.  Minimal BTB x1, 2 weeks ago. Feeling warm at night.  No hot flushes.  No pelvic pain.  No pain with IC.  Pap 06/2019 Neg. Pap reflex today. Urine normal. Bowel movements normal. Breasts normal.  Screening mammo 09/2021 Neg. Body mass index 37.19.  Not exercising regularly.  Health labs with family physician. COLONOSCOPY: 12-30-20 - Cytology - PAP( [place])  2. Encounter for routine checking of intrauterine contraceptive device (IUD) Mirena IUD x 08/2017, well tolerated and in good position.  3. Night sweats Check menopausal status with [facility] today. - [facility]  4. Class 2  severe obesity due to excess calories with serious comorbidity and body mass index (BMI) of 37.0 to 37.9 in adult [facility])  Recommend a lower Calorie/Carb diet.  Aerobic activities 5x a week and light weight lifting every 2 days.  Princess Bruins MD, 4:28 PM 11/08/2021

## 2021-11-13 LAB — CYTOLOGY - PAP
Comment: NEGATIVE
Diagnosis: UNDETERMINED — AB
High risk HPV: NEGATIVE

## 2021-11-16 ENCOUNTER — Telehealth: Payer: Self-pay

## 2021-11-16 NOTE — Telephone Encounter (Signed)
-----   Message from Princess Bruins, MD sent at 11/16/2021  1:13 PM EST ----- Second ASCUS/HPV HR Neg.  Schedule Colposcopy.

## 2021-11-16 NOTE — Telephone Encounter (Signed)
I spoke with patient and informed her of result and need for Colposcopy.  Patient questioned where you wrote "Second Ascus/HPV HR neg". She said she did not remember having an abnormal pap result.  I looked back and she did not have a Pap smear last year and her Pap smear in 2020 was normal result.  I told patient I would check with you and call her back.  Please advise.   Thanks

## 2021-11-20 NOTE — Telephone Encounter (Signed)
Patient aware of the below  

## 2021-11-22 DIAGNOSIS — Z20822 Contact with and (suspected) exposure to covid-19: Secondary | ICD-10-CM | POA: Diagnosis not present

## 2021-11-22 DIAGNOSIS — Z20828 Contact with and (suspected) exposure to other viral communicable diseases: Secondary | ICD-10-CM | POA: Diagnosis not present

## 2021-11-22 DIAGNOSIS — Z1159 Encounter for screening for other viral diseases: Secondary | ICD-10-CM | POA: Diagnosis not present

## 2021-11-23 ENCOUNTER — Other Ambulatory Visit: Payer: Self-pay | Admitting: Family Medicine

## 2021-11-25 DIAGNOSIS — R051 Acute cough: Secondary | ICD-10-CM | POA: Diagnosis not present

## 2021-11-25 DIAGNOSIS — J029 Acute pharyngitis, unspecified: Secondary | ICD-10-CM | POA: Diagnosis not present

## 2021-11-25 DIAGNOSIS — M7918 Myalgia, other site: Secondary | ICD-10-CM | POA: Diagnosis not present

## 2021-11-25 DIAGNOSIS — R0981 Nasal congestion: Secondary | ICD-10-CM | POA: Diagnosis not present

## 2021-11-25 DIAGNOSIS — U071 COVID-19: Secondary | ICD-10-CM | POA: Diagnosis not present

## 2021-11-29 ENCOUNTER — Telehealth: Payer: Self-pay | Admitting: *Deleted

## 2021-11-29 NOTE — Telephone Encounter (Signed)
This RN spoke with pt per her call stating the "televisit" appt with Dr Lindi Adie on 10/17/2021 is not being covered by her [employer] benefit and she is being billed $175.  She states this happened with her primary MD as well - but resubmitted with code of 12929 and it was covered.  This RN informed pt above would be forwarded to our

## 2022-01-22 NOTE — Telephone Encounter (Signed)
No entry 

## 2022-03-15 ENCOUNTER — Telehealth: Payer: Self-pay | Admitting: Hematology and Oncology

## 2022-03-15 NOTE — Telephone Encounter (Signed)
Per provider called pt to reschedule phone visit.  Pt stated she just wanted to cancel this visit.  Appointment was canceled per pt request.   ?

## 2022-03-19 ENCOUNTER — Other Ambulatory Visit: Payer: Self-pay | Admitting: Family Medicine

## 2022-04-02 ENCOUNTER — Ambulatory Visit (INDEPENDENT_AMBULATORY_CARE_PROVIDER_SITE_OTHER): Payer: BC Managed Care – PPO

## 2022-04-02 ENCOUNTER — Ambulatory Visit
Admission: RE | Admit: 2022-04-02 | Discharge: 2022-04-02 | Disposition: A | Discharge status: Home / Self Care | Payer: BC Managed Care – PPO | Source: Ambulatory Visit | Attending: Emergency Medicine | Admitting: Emergency Medicine

## 2022-04-02 VITALS — BP 147/84 | HR 64 | Temp 98.1°F | Resp 16

## 2022-04-02 DIAGNOSIS — R3129 Other microscopic hematuria: Secondary | ICD-10-CM | POA: Diagnosis not present

## 2022-04-02 DIAGNOSIS — M5136 Other intervertebral disc degeneration, lumbar region: Secondary | ICD-10-CM | POA: Diagnosis not present

## 2022-04-02 DIAGNOSIS — M545 Low back pain, unspecified: Secondary | ICD-10-CM | POA: Insufficient documentation

## 2022-04-02 DIAGNOSIS — M5431 Sciatica, right side: Secondary | ICD-10-CM

## 2022-04-02 DIAGNOSIS — Z113 Encounter for screening for infections with a predominantly sexual mode of transmission: Secondary | ICD-10-CM | POA: Diagnosis not present

## 2022-04-02 DIAGNOSIS — M79604 Pain in right leg: Secondary | ICD-10-CM | POA: Insufficient documentation

## 2022-04-02 LAB — POCT URINALYSIS DIP (MANUAL ENTRY)
Bilirubin, UA: NEGATIVE
Blood, UA: NEGATIVE — AB
Glucose, UA: NEGATIVE mg/dL
Leukocytes, UA: NEGATIVE
Nitrite, UA: NEGATIVE
Protein Ur, POC: NEGATIVE mg/dL
Spec Grav, UA: 1.015 (ref 1.010–1.025)
Urobilinogen, UA: 0.2 E.U./dL
pH, UA: 6 (ref 5.0–8.0)

## 2022-04-02 MED ORDER — BACLOFEN 10 MG PO TABS: 10.0000 mg | ORAL_TABLET | Freq: Every day | ORAL | 0 refills | Status: DC

## 2022-04-02 MED ORDER — METHYLPREDNISOLONE 4 MG PO TBPK: ORAL_TABLET | ORAL | 0 refills | Status: DC

## 2022-04-02 MED ORDER — DICLOFENAC SODIUM 1 % EX GEL: 4.0000 g | Freq: Four times a day (QID) | CUTANEOUS | 2 refills | Status: DC

## 2022-04-02 MED ORDER — SULFAMETHOXAZOLE-TRIMETHOPRIM 800-160 MG PO TABS: 1.0000 | ORAL_TABLET | Freq: Two times a day (BID) | ORAL | 0 refills | Status: AC

## 2022-04-02 MED ORDER — FLUCONAZOLE 150 MG PO TABS: ORAL_TABLET | ORAL | 0 refills | Status: DC

## 2022-04-02 NOTE — ED Triage Notes (Addendum)
Patient c/o bilateral lower back pain x 2 days.  ? ?Patient denies fall or trauma.  ? ?Patient endorses pain was going down RT leg upon onset of symptoms.  ? ?Patient endorses ABD bloating and mid lower ABD tenderness.  ? ?Patient took Ibuprofen with some relief upon onset of symptoms.  ?

## 2022-04-02 NOTE — Discharge Instructions (Addendum)
The mainstay of therapy for musculoskeletal pain is reduction of inflammation and relaxation of tension which is causing inflammation.  Keep in mind, pain always begets more pain.  To help you stay ahead of your pain and inflammation, I have provided the following regimen for you: ?  ?This evening, please begin taking baclofen 10 mg.  This is a highly effective muscle relaxer and antispasmodic which should continue to provide you with relaxation of your tense muscles, allow you to sleep well and to keep your pain under control. ?  ?Tomorrow morning, please begin taking methylprednisolone.  Please take 1 full row tablets at once with your breakfast meal.  If you have had significant resolution of your pain before you finish the entire prescription, please feel free to discontinue.  It is not important to finish every ta dose blet. ?  ?During the day, please set aside time to apply ice to the affected area 4 times daily for 20 minutes each application.  This can be achieved by using a bag of frozen peas or corn, a Ziploc bag filled with ice and water, or Ziploc bag filled with half rubbing alcohol and half Dawn dish detergent, frozen into a slush.  Please be careful not to apply ice directly to your skin, always place a soft cloth between you and the ice pack. ?  ?You are welcome to use topical anti-inflammatory creams such as Voltaren gel, capsaicin or Aspercreme as recommended.  These medications are available over-the-counter, please follow manufactures instructions for use.  As a courtesy, I provided you with a prescription for diclofenac in the event that your insurance will pay for this. ?  ?Please consider discussing referral to physical therapy with your primary care provider.  Physical therapist are very good at teasing out the underlying cause of acute lower back pain and helping with prevention of future recurrences. ?  ?Please avoid attempts to stretch or strengthen the affected area until you are feeling  completely pain-free.  Attempts to do so will only prolong the healing process. ?  ?I also recommend that you remain out of work for the next several days, I provided you with a note to return to work in 3 days.  If you feel that you need this time extended, please follow-up with your primary care provider or return to urgent care for reevaluation so that we can provide you with a note for another 3 days. ? ?The urinalysis that we performed in the clinic today was abnormal.  Urine culture will be performed per our protocol.  The result of the urine culture will be available in the next 3 to 5 days and will be posted to your MyChart account.  If there is an abnormal finding, you will be contacted by phone and advised of further treatment recommendations, if any. ?  ?You were advised to begin antibiotics today because you are having active symptoms of a urinary tract infection.  It is very important that you take all doses exactly as prescribed.  Incomplete antibiotic therapy can cause worsening urinary tract infection that can become aggressive, reach the level of your kidneys causing kidney infection and possible hospitalization. ?  ?Because we know that antibiotic treatment can often cause vaginal yeast infections, I have also provided you with a prescription for fluconazole (Diflucan).  Please take the first tablet on the third day of your antibiotics and take the second tablet two days after the first tablet. ?  ?If you have not had complete  resolution of your symptoms after completing treatment as prescribed, please return to urgent care for repeat evaluation or follow-up with your primary care provider. ? ?  ?Thank you for visiting urgent care today.  We appreciate the opportunity to participate in your care. ? ?

## 2022-04-02 NOTE — ED Provider Notes (Signed)
?[facility] ? ? ? ?CSN: [id number] ?Arrival date & time: 04/02/22  1308 ?  ? ?HISTORY  ? ?Chief Complaint  ?Patient presents with  ? Back Pain  ? ?HPI ?Amy Coffey is a 61 y.o. female. Patient complains of intermittent bilateral lower back pain and stiffness for the past 2 days.  Patient states she did not fall and was not injured in any way.  Patient states she had a burning pain radiating down her right leg when her symptoms began which she has never had before, states this pain has resolved but she continues to feel stiff, particularly on rising.  Patient reports a history of known osteoarthritis in both of her hips.  Patient states she is also felt a little bit bloated and had some lower abdominal pressure and discomfort, reports having a "satisfying" bowel movement this morning but states she may have been a little constipated for a few days prior.  Patient states she took ibuprofen 400 mg when her pain began 2 days ago with some relief of her symptoms, has not taken any more.  Patient has a mildly elevated blood pressure on arrival today with normal heart rate.  Patient is a former smoker.  Patient denies increased frequency of urination, burning with urination, urine malodor, history of kidney stones. ? ?The history is provided by the patient.  ?Past Medical History:  ?Diagnosis Date  ? Arthritis   ? knees  ? Heart murmur   ? per pt had echo done yrs ago was told mild  ? History of hyperprolactinemia   ? 02/ 2018  resolved  ? Hyperlipidemia   ? Hypertension   ? Iron deficiency anemia   ? Menometrorrhagia   ? REFRACTORY TO PROGESTIN  ? Ulcerative colitis ([place]) followed by [facility]  ? per pt dx 1990s  ? Wears glasses   ? ?Patient Active Problem List  ? Diagnosis Date Noted  ? Vitamin D deficiency, unspecified 03/22/2021  ? Ulcerative colitis ([place]) 10/06/2020  ? Normocytic anemia 03/03/2018  ? Iron deficiency anemia 07/15/2017  ? Hyperlipemia 01/17/2017  ? Class 2 obesity with body mass index  (BMI) of 37.0 to 37.9 in adult 01/17/2017  ? Hypertension, essential 01/17/2017  ? Dysfunctional uterine bleeding 01/17/2017  ? Abnormal finding on MRI of brain 01/07/2017  ? ?Past Surgical History:  ?Procedure Laterality Date  ? [place]  ? ECTOPIC PREGNANCY SURGERY  2002 approx.  ? right partial salpingectomy via lapartomy  ? EXPLORATORY LAPAROTOMY W/ LEFT SALPINGECTOMY FOR ECTOPIC  03-31-2003   dr Lahoma Crocker  ? INTRAUTERINE DEVICE (IUD) INSERTION    ? mirena inserted 08-13-17  ? ?OB History   ? ? Gravida  ?5  ? Para  ?1  ? Term  ?   ? Preterm  ?   ? AB  ?4  ? Living  ?1  ?  ? ? SAB  ?   ? IAB  ?   ? Ectopic  ?4  ? Multiple  ?   ? Live Births  ?   ?   ?  ?  ? ?Home Medications   ? ?Prior to Admission medications   ?Medication Sig Start Date End Date Taking? Authorizing Provider  ?FeFum-FePoly-FA-B Cmp-C-Biot (FOLIVANE-PLUS) CAPS TAKE 1 CAPSULE BY MOUTH EVERY DAY 03/19/22  Yes [place], Betty G, MD  ?losartan-hydrochlorothiazide (HYZAAR) 100-25 MG tablet Take 1 tablet by mouth daily. 03/31/21  Yes [place], Betty G, MD  ?mesalamine (APRISO) 0.375 g 24 hr  capsule Take 1,500 mg by mouth every morning.    Yes [provider]  ?rosuvastatin (CRESTOR) 10 MG tablet Take 1 tablet (10 mg total) by mouth daily. 10/16/21  Yes [place], Betty G, MD  ?Vitamin D, Ergocalciferol, (DRISDOL) 1.25 MG (50000 UNIT) CAPS capsule Take 1 capsule (50,000 Units total) by mouth every 14 (fourteen) days. 10/12/21  Yes [place], Betty G, MD  ?carbamide peroxide (DEBROX) 6.5 % OTIC solution Place 5 drops into the left ear 2 (two) times daily. 10/09/21   [place], Betty G, MD  ? ? ?Family History ?Family History  ?Problem Relation Age of Onset  ? Angina Mother   ? Thyroid disease Mother   ? Arthritis Mother   ? Heart disease Mother   ? Hypertension Mother   ? Hyperlipidemia Mother   ? Prostate cancer Father   ? Arthritis Father   ? Cancer Father   ?     prostate  ? Hypertension Father   ? Cancer Maternal Aunt   ?     breast  ?  Breast cancer Maternal Aunt   ? Breast cancer Cousin   ? ?Social History ?Social History  ? ?Tobacco Use  ? Smoking status: Former  ?  Years: 10.00  ?  Types: Cigarettes  ?  Quit date: 12/03/1997  ?  Years since quitting: 24.3  ? Smokeless tobacco: Never  ?Vaping Use  ? Vaping Use: Never used  ?Substance Use Topics  ? Alcohol use: No  ? Drug use: No  ? ?Allergies   ?Patient has no known allergies. ? ?Review of Systems ?Review of Systems ?Pertinent findings noted in history of present illness.  ? ?Physical Exam ?Triage Vital Signs ?ED Triage Vitals  ?Enc Vitals Group  ?   BP 09/29/21 0827 (!) 147/82  ?   Pulse Rate 09/29/21 0827 72  ?   Resp 09/29/21 0827 18  ?   Temp 09/29/21 0827 98.3 ?F (36.8 ?C)  ?   Temp Source 09/29/21 0827 Oral  ?   SpO2 09/29/21 0827 98 %  ?   Weight --   ?   Height --   ?   Head Circumference --   ?   Peak Flow --   ?   Pain Score 09/29/21 0826 5  ?   Pain Loc --   ?   Pain Edu? --   ?   Excl. in [place]? --   ?No data found. ? ?Updated Vital Signs ?BP (!) 147/84 (BP Location: Left Arm)   Pulse 64   Temp 98.1 ?F (36.7 ?C) (Oral)   Resp 16   LMP  (LMP Unknown)   SpO2 95%  ? ?Physical Exam ?Vitals and nursing note reviewed.  ?Constitutional:   ?   General: She is not in acute distress. ?   Appearance: Normal appearance. She is not ill-appearing.  ?HENT:  ?   Head: Normocephalic and atraumatic.  ?Eyes:  ?   General: Lids are normal.     ?   Right eye: No discharge.     ?   Left eye: No discharge.  ?   Extraocular Movements: Extraocular movements intact.  ?   Conjunctiva/sclera: Conjunctivae normal.  ?   Right eye: Right conjunctiva is not injected.  ?   Left eye: Left conjunctiva is not injected.  ?Neck:  ?   Trachea: Trachea and phonation normal.  ?Cardiovascular:  ?   Rate and Rhythm: Normal rate and regular rhythm.  ?  Pulses: Normal pulses.  ?   Heart sounds: Normal heart sounds. No murmur heard. ?  No friction rub. No gallop.  ?Pulmonary:  ?   Effort: Pulmonary effort is normal. No  accessory muscle usage, prolonged expiration or respiratory distress.  ?   Breath sounds: Normal breath sounds. No stridor, decreased air movement or transmitted upper airway sounds. No decreased breath sounds, wheezing, rhonchi or rales.  ?Chest:  ?   Chest wall: No tenderness.  ?Abdominal:  ?   General: Abdomen is flat. Bowel sounds are normal.  ?   Palpations: Abdomen is soft.  ?   Tenderness: There is abdominal tenderness in the suprapubic area.  ?Musculoskeletal:     ?   General: Tenderness (Bilateral SI joints and surrounding lumbar paraspinous muscles) present. Normal range of motion.  ?   Cervical back: Normal range of motion and neck supple. Normal range of motion.  ?Lymphadenopathy:  ?   Cervical: No cervical adenopathy.  ?Skin: ?   General: Skin is warm and dry.  ?   Findings: No erythema or rash.  ?Neurological:  ?   General: No focal deficit present.  ?   Mental Status: She is alert and oriented to person, place, and time.  ?Psychiatric:     ?   Mood and Affect: Mood normal.     ?   Behavior: Behavior normal.  ? ? ?Visual Acuity ?Right Eye Distance:   ?Left Eye Distance:   ?Bilateral Distance:   ? ?Right Eye Near:   ?Left Eye Near:    ?Bilateral Near:    ? ?UC Couse / Diagnostics / Procedures:  ?  ?EKG ? ?Radiology ?DG Lumbar Spine Complete ? ?Result Date: 04/02/2022 ?CLINICAL DATA:  bilateral lower back pain w/ right sided sciatica EXAM: LUMBAR SPINE - COMPLETE 4+ VIEW COMPARISON:  No prior images retrievable at the time of this dictation. FINDINGS: There are 5 non-rib-bearing lumbar vertebrae. There is no evidence of lumbar spine fracture. There is mild multilevel degenerative disc disease, worst at L4-L5 and L5-S1. There is mild lower lumbar predominant facet arthropathy. IUD overlies the pelvis. IMPRESSION: No evidence of lumbar spine fracture. Mild multilevel degenerative disc disease worst at L4-L5 and L5-S1. Mild lower lumbar predominant facet arthropathy. Electronically Signed   By: Maurine Simmering  M.D.   On: 04/02/2022 14:37   ? ?Procedures ?Procedures (including critical care time) ? ?UC Diagnoses / Final Clinical Impressions(s)   ?I have reviewed the triage vital signs and the nursing notes. ? ?Pertinent labs

## 2022-04-03 ENCOUNTER — Ambulatory Visit: Payer: BC Managed Care – PPO | Admitting: Family Medicine

## 2022-04-03 LAB — CBC WITH DIFFERENTIAL/PLATELET
Basophils Absolute: 0.1 10*3/uL (ref 0.0–0.2)
Basos: 1 %
EOS (ABSOLUTE): 0.1 10*3/uL (ref 0.0–0.4)
Eos: 1 %
Hematocrit: 38.7 % (ref 34.0–46.6)
Hemoglobin: 12.5 g/dL (ref 11.1–15.9)
Immature Grans (Abs): 0 10*3/uL (ref 0.0–0.1)
Immature Granulocytes: 0 %
Lymphocytes Absolute: 3.4 10*3/uL — ABNORMAL HIGH (ref 0.7–3.1)
Lymphs: 24 %
MCH: 27.5 pg (ref 26.6–33.0)
MCHC: 32.3 g/dL (ref 31.5–35.7)
MCV: 85 fL (ref 79–97)
Monocytes Absolute: 0.7 10*3/uL (ref 0.1–0.9)
Monocytes: 5 %
Neutrophils Absolute: 10 10*3/uL — ABNORMAL HIGH (ref 1.4–7.0)
Neutrophils: 69 %
Platelets: 535 10*3/uL — ABNORMAL HIGH (ref 150–450)
RBC: 4.55 x10E6/uL (ref 3.77–5.28)
RDW: 13.3 % (ref 11.7–15.4)
WBC: 14.4 10*3/uL — ABNORMAL HIGH (ref 3.4–10.8)

## 2022-04-03 LAB — CERVICOVAGINAL ANCILLARY ONLY
Bacterial Vaginitis (gardnerella): NEGATIVE
Candida Glabrata: NEGATIVE
Candida Vaginitis: NEGATIVE
Chlamydia: NEGATIVE
Comment: NEGATIVE
Comment: NEGATIVE
Comment: NEGATIVE
Comment: NEGATIVE
Comment: NEGATIVE
Comment: NORMAL
Neisseria Gonorrhea: NEGATIVE
Trichomonas: NEGATIVE

## 2022-04-03 LAB — URINE CULTURE: Culture: NO GROWTH

## 2022-04-03 LAB — RPR: RPR Ser Ql: NONREACTIVE

## 2022-04-03 LAB — HIV ANTIBODY (ROUTINE TESTING W REFLEX): HIV Screen 4th Generation wRfx: NONREACTIVE

## 2022-04-09 ENCOUNTER — Encounter: Payer: Self-pay | Admitting: Family Medicine

## 2022-04-09 ENCOUNTER — Emergency Department (HOSPITAL_BASED_OUTPATIENT_CLINIC_OR_DEPARTMENT_OTHER): Payer: BC Managed Care – PPO

## 2022-04-09 ENCOUNTER — Emergency Department (HOSPITAL_COMMUNITY)
Admission: EM | Admit: 2022-04-09 | Discharge: 2022-04-09 | Disposition: A | Discharge status: Home / Self Care | Payer: BC Managed Care – PPO | Attending: Emergency Medicine | Admitting: Emergency Medicine

## 2022-04-09 ENCOUNTER — Ambulatory Visit: Payer: BC Managed Care – PPO | Admitting: Family Medicine

## 2022-04-09 ENCOUNTER — Other Ambulatory Visit: Payer: Self-pay

## 2022-04-09 ENCOUNTER — Emergency Department (HOSPITAL_COMMUNITY): Payer: BC Managed Care – PPO

## 2022-04-09 ENCOUNTER — Encounter (HOSPITAL_COMMUNITY): Payer: Self-pay | Admitting: Emergency Medicine

## 2022-04-09 ENCOUNTER — Ambulatory Visit (INDEPENDENT_AMBULATORY_CARE_PROVIDER_SITE_OTHER): Payer: BC Managed Care – PPO

## 2022-04-09 VITALS — BP 130/82 | HR 123 | Temp 98.5°F | Ht 67.5 in | Wt 232.2 lb

## 2022-04-09 DIAGNOSIS — M79604 Pain in right leg: Secondary | ICD-10-CM

## 2022-04-09 DIAGNOSIS — R0789 Other chest pain: Secondary | ICD-10-CM | POA: Diagnosis not present

## 2022-04-09 DIAGNOSIS — R791 Abnormal coagulation profile: Secondary | ICD-10-CM | POA: Diagnosis not present

## 2022-04-09 DIAGNOSIS — M79605 Pain in left leg: Secondary | ICD-10-CM

## 2022-04-09 DIAGNOSIS — I10 Essential (primary) hypertension: Secondary | ICD-10-CM

## 2022-04-09 DIAGNOSIS — R000 Tachycardia, unspecified: Secondary | ICD-10-CM | POA: Diagnosis not present

## 2022-04-09 DIAGNOSIS — R1030 Lower abdominal pain, unspecified: Secondary | ICD-10-CM | POA: Diagnosis not present

## 2022-04-09 DIAGNOSIS — J302 Other seasonal allergic rhinitis: Secondary | ICD-10-CM

## 2022-04-09 DIAGNOSIS — R059 Cough, unspecified: Secondary | ICD-10-CM

## 2022-04-09 DIAGNOSIS — M549 Dorsalgia, unspecified: Secondary | ICD-10-CM | POA: Insufficient documentation

## 2022-04-09 DIAGNOSIS — R911 Solitary pulmonary nodule: Secondary | ICD-10-CM | POA: Diagnosis not present

## 2022-04-09 DIAGNOSIS — R002 Palpitations: Secondary | ICD-10-CM | POA: Diagnosis not present

## 2022-04-09 DIAGNOSIS — R079 Chest pain, unspecified: Secondary | ICD-10-CM | POA: Diagnosis not present

## 2022-04-09 DIAGNOSIS — G8929 Other chronic pain: Secondary | ICD-10-CM

## 2022-04-09 DIAGNOSIS — M545 Low back pain, unspecified: Secondary | ICD-10-CM

## 2022-04-09 DIAGNOSIS — R109 Unspecified abdominal pain: Secondary | ICD-10-CM | POA: Diagnosis not present

## 2022-04-09 LAB — CBC WITH DIFFERENTIAL/PLATELET
Basophils Absolute: 0.1 10*3/uL (ref 0.0–0.1)
Basophils Relative: 0.7 % (ref 0.0–3.0)
Eosinophils Absolute: 0.6 10*3/uL (ref 0.0–0.7)
Eosinophils Relative: 5.8 % — ABNORMAL HIGH (ref 0.0–5.0)
HCT: 41.7 % (ref 36.0–46.0)
Hemoglobin: 13.3 g/dL (ref 12.0–15.0)
Lymphocytes Relative: 15.2 % (ref 12.0–46.0)
Lymphs Abs: 1.7 10*3/uL (ref 0.7–4.0)
MCHC: 31.9 g/dL (ref 30.0–36.0)
MCV: 85.5 fl (ref 78.0–100.0)
Monocytes Absolute: 1.6 10*3/uL — ABNORMAL HIGH (ref 0.1–1.0)
Monocytes Relative: 14.8 % — ABNORMAL HIGH (ref 3.0–12.0)
Neutro Abs: 7 10*3/uL (ref 1.4–7.7)
Neutrophils Relative %: 63.5 % (ref 43.0–77.0)
Platelets: 520 10*3/uL — ABNORMAL HIGH (ref 150.0–400.0)
RBC: 4.88 Mil/uL (ref 3.87–5.11)
RDW: 15.2 % (ref 11.5–15.5)
WBC: 11.1 10*3/uL — ABNORMAL HIGH (ref 4.0–10.5)

## 2022-04-09 LAB — MAGNESIUM: Magnesium: 2.1 mg/dL (ref 1.7–2.4)

## 2022-04-09 LAB — HEPATIC FUNCTION PANEL
ALT: 27 U/L (ref 0–44)
AST: 21 U/L (ref 15–41)
Albumin: 4.1 g/dL (ref 3.5–5.0)
Alkaline Phosphatase: 115 U/L (ref 38–126)
Bilirubin, Direct: 0.1 mg/dL (ref 0.0–0.2)
Indirect Bilirubin: 0.9 mg/dL (ref 0.3–0.9)
Total Bilirubin: 1 mg/dL (ref 0.3–1.2)
Total Protein: 8 g/dL (ref 6.5–8.1)

## 2022-04-09 LAB — BASIC METABOLIC PANEL
BUN: 15 mg/dL (ref 6–23)
CO2: 28 mEq/L (ref 19–32)
Calcium: 9.3 mg/dL (ref 8.4–10.5)
Chloride: 97 mEq/L (ref 96–112)
Creatinine, Ser: 1 mg/dL (ref 0.40–1.20)
GFR: 61.83 mL/min (ref >60.00)
Glucose, Bld: 109 mg/dL — ABNORMAL HIGH (ref 70–99)
Potassium: 3.7 mEq/L (ref 3.5–5.1)
Sodium: 134 mEq/L — ABNORMAL LOW (ref 135–145)

## 2022-04-09 LAB — TROPONIN I (HIGH SENSITIVITY)
Troponin I (High Sensitivity): 2 ng/L (ref <18)
Troponin I (High Sensitivity): 3 ng/L (ref <18)

## 2022-04-09 LAB — D-DIMER, QUANTITATIVE: D-Dimer, Quant: 0.66 mcg/mL FEU — ABNORMAL HIGH (ref <0.50)

## 2022-04-09 LAB — LACTIC ACID, PLASMA: Lactic Acid, Venous: 1.1 mmol/L (ref 0.5–1.9)

## 2022-04-09 LAB — POC COVID19 BINAXNOW: SARS Coronavirus 2 Ag: NEGATIVE

## 2022-04-09 LAB — TSH: TSH: 0.99 u[IU]/mL (ref 0.35–5.50)

## 2022-04-09 MED ORDER — IOHEXOL 350 MG/ML SOLN
100.0000 mL | Freq: Once | INTRAVENOUS | Status: AC | PRN
  Administered 2022-04-09: 100 mL via INTRAVENOUS

## 2022-04-09 MED ORDER — LACTATED RINGERS BOLUS PEDS
1000.0000 mL | Freq: Once | INTRAVENOUS | Status: AC
  Administered 2022-04-09: 1000 mL via INTRAVENOUS

## 2022-04-09 MED ORDER — FLUTICASONE PROPIONATE 50 MCG/ACT NA SUSP
2.0000 | Freq: Every evening | NASAL | 3 refills | Status: DC | PRN
Start: 2022-04-09 — End: 2022-09-27

## 2022-04-09 MED ORDER — IBUPROFEN 800 MG PO TABS: 800.0000 mg | ORAL_TABLET | Freq: Once | ORAL | Status: DC

## 2022-04-09 NOTE — ED Notes (Signed)
Pt ambulated to bathroom and upon returning to room, rn placed pt back on monitor, pt's heart rate noted to be in the 140's. Pt reports this is abnormal for her. MD aware, cardiac monitoring on pt. ?

## 2022-04-09 NOTE — Discharge Instructions (Addendum)
Please follow-up with your primary care doctor as well as with cardiology.  I have placed an order for referral to cardiology.  If you do not hear a phone call in the next day or so I would recommend calling their office to request this appointment.  We recommended admitting you to the hospital due to your persistently elevated heart rate but you have chosen to leave [place].  If you change your mind at any time or if you develop new symptoms such as worsening chest pain, difficulty breathing, vomiting, fever, please come back to ER for reassessment. ? ?The radiologist commented on a small pulmonary nodule.  Please discuss with your primary care doctor to see if they feel you need further testing such as repeat CT scan for monitoring.  The radiologist also commented on a 10 mm sclerotic density in the body of T3 vertebra.  Please also discuss this with your primary care doctor and they may want to order additional testing or monitoring. ?

## 2022-04-09 NOTE — ED Provider Triage Note (Signed)
Emergency Medicine Provider Triage Evaluation Note ? ?Amy Coffey , a 61 y.o. female  was evaluated in triage.  Pt complains of back pain, and calf pain.  Patient was seen at urgent care earlier today where she is found to be tachycardic with elevated D-dimer.  Patient was sent to the emergency department for further evaluation. ? ?Patient endorses feeling some chest discomfort.  She denies any shortness of breath, mopped assist, lightheadedness, syncope, leg swelling. ? ?Review of Systems  ?Positive: See above ?Negative: See above ? ?Physical Exam  ?BP 127/90   Pulse (!) 153   Temp 99.6 ?F (37.6 ?C) (Oral)   Resp 20   Ht 5' 7.5" (1.715 m)   Wt 105 kg   LMP  (LMP Unknown)   SpO2 98%   BMI 35.72 kg/m?  ?Gen:   Awake, no distress   ?Resp:  Normal effort  ?MSK:   Moves extremities without difficulty  ?Other:  No swelling or tenderness to bilateral lower extremities. ? ?Medical Decision Making  ?Medically screening exam initiated at 3:33 PM.  Appropriate orders placed.  Amy Coffey was informed that the remainder of the evaluation will be completed by another provider, this initial triage assessment does not replace that evaluation, and the importance of remaining in the ED until their evaluation is complete. ? ?EKG shows sinus tachycardia with ventricular rate in the 140s.  With elevated D-dimer will place order for CTA to evaluate for possible PE causing patient's tachycardia and chest discomfort. ? ?Chest x-ray, BMP and CBC were completed earlier today. ?  ?Loni Beckwith, PA-C ?04/09/22 1536 ? ?

## 2022-04-09 NOTE — ED Triage Notes (Signed)
Pt states she has been having calf pain so her PCP sent some labs. Here for elevated ddimer. Denies any CP or SOB. Endorses fatigue.  ?

## 2022-04-09 NOTE — ED Notes (Signed)
Pt requesting bp cuff to not take as frequently ?

## 2022-04-09 NOTE — Patient Instructions (Addendum)
A few things to remember from today's visit: ? ?Hypertension, essential - Plan: Basic metabolic panel ? ?Chest tightness - Plan: EKG 12-Lead, D-dimer, Quantitative, DG Chest 2 View, CBC with Differential/Platelet, POC COVID-19 ? ?Sinus tachycardia - Plan: D-dimer, Quantitative, TSH, CBC with Differential/Platelet ? ?Seasonal allergic rhinitis, unspecified trigger ? ?Chronic bilateral low back pain, unspecified whether sciatica present ? ?If you need refills please call your pharmacy. ?Do not use My Chart to request refills or for acute issues that need immediate attention. ?If any shortness of breath or worsening chest tightness you need to seek immediate medical attention. ?If D-dimer (coagulation test) is positive you will be sent to the ER to have it chest CT. ?Continue monitoring your heart rate and temperature. ?Monitor for new symptoms. ?Continue adequate hydration. ? ? ?Please be sure medication list is accurate. ?If a new problem present, please set up appointment sooner than planned today. ? ? ? ? ? ? ? ?

## 2022-04-09 NOTE — Progress Notes (Signed)
Lower extremity venous has been completed.  ? ?Preliminary results in CV Proc.  ? ?Jaydin Jalomo Christan Defranco ?04/09/2022 4:22 PM    ?

## 2022-04-09 NOTE — Progress Notes (Signed)
? ?ACUTE VISIT ?Chief Complaint  ?Patient presents with  ? Back Pain  ?  Had urgent care visit on Monday, pain in lower back and hip going down into right leg. X-ray showed arthritis. Given prednisone & Abx for possible UTI. Having tenderness in upper back.   ? Cough  ?  Dry cough with achy eyes. Had a headache around the back of her head and the front.  ? ?HPI: ?Amy Coffey is a 61 y.o. female with history of hypertension, hyperlipidemia, iron deficiency anemia, and ulcerative colitis here today complaining of back pain and cough as described above. ?Evaluated in the ED because back pain on 04/02/22. ?Treated with Bactrim for possible UTI. ?UCx no growth. ? ?Also given Methylprednisolone, with help with pain while she was taking it but started again 2 days later. ? ?Lumbar X ray  ?No evidence of lumbar spine fracture. Mild multilevel degenerative disc disease worst at L4-L5 and L5-S1.  ?Mild lower lumbar predominant facet arthropathy. ?She has history of chronic back pain but states that this pain feels different. ?L>R. ?Radiated to right thigh and calf. ?She is also complaining of bilateral hip pain. ? ?Negative for recent injury or unusual level of activity. ? ?No associated LE numbness, tingling, urinary incontinence or retention, stool incontinence, or saddle anesthesia. ?Exacerbated by movement. ?Alleviated by rest. ?No rash or edema on area, fever, chills, or abnormal wt loss. ? ?She already has an appt with ortho 04/12/22. ? ?Cough ?This is a new problem. The current episode started in the past 7 days. The problem has been unchanged. The problem occurs every few hours. The cough is Non-productive. Associated symptoms include nasal congestion, postnasal drip and rhinorrhea. Pertinent negatives include no ear congestion, ear pain, fever, heartburn, hemoptysis, rash, sore throat, sweats or weight loss. Nothing aggravates the symptoms. Her past medical history is significant for environmental allergies.   ?Frontal pressure headache, no associated photophobia or vomiting. ?Took a zyrtec yesterday. ?Abdominal bloating sensation and nausea for about 2 weeks, she started drinking green smoothies. ? ?+ Fatigue, for the past couple days. ?No sick contact. ?Lab Results  ?Component Value Date  ? TSH 1.07 03/22/2021  ? ?Hypertension:  ?Medications: Losartan-HCTZ 100-25 mg daily. ?Negative for visual changes or focal weakness. ?Intermittent LE cramps. ? ?Lab Results  ?Component Value Date  ? CREATININE 0.81 10/09/2021  ? BUN 11 10/09/2021  ? NA 139 10/09/2021  ? K 3.4 (L) 10/09/2021  ? CL 105 10/09/2021  ? CO2 27 10/09/2021  ? ?Feeling "something here" points of mid upper chest, heart racing for the past couple days. ?She cannot describe symptoms, maybe tightness,not radiated. ?Noted sinus tachycardia today. ?Negative for new medications. ?Feeling hot and cold, hx of hot flashes but it does not feel the same. ?  ?No wheezing or SOB. ?Calves pain, intermittently for months and attributed to statin, which she takes every other day.  No associated edema or erythema. ?Negative for recent travel, surgery, or changes in mobility. ?She is not on hormonal therapy. ?History of thrombocytosis, she follows with hematologist. ? ?Lab Results  ?Component Value Date  ? WBC 14.4 (H) 04/02/2022  ? HGB 12.5 04/02/2022  ? HCT 38.7 04/02/2022  ? MCV 85 04/02/2022  ? PLT 535 (H) 04/02/2022  ? ?Review of Systems  ?Constitutional:  Negative for fever and weight loss.  ?HENT:  Positive for postnasal drip and rhinorrhea. Negative for ear pain and sore throat.   ?Respiratory:  Positive for cough. Negative for hemoptysis.   ?  Gastrointestinal:  Negative for heartburn.  ?Skin:  Negative for rash.  ?Allergic/Immunologic: Positive for environmental allergies.  ?Rest see pertinent positives and negatives per HPI. ? ?Current Outpatient Medications on File Prior to Visit  ?Medication Sig Dispense Refill  ? carbamide peroxide (DEBROX) 6.5 % OTIC solution Place  5 drops into the left ear 2 (two) times daily. 15 mL 0  ? diclofenac Sodium (VOLTAREN) 1 % GEL Apply 4 g topically 4 (four) times daily. Apply to affected areas 4 times daily as needed for pain. 100 g 2  ? FeFum-FePoly-FA-B Cmp-C-Biot (FOLIVANE-PLUS) CAPS TAKE 1 CAPSULE BY MOUTH EVERY DAY 90 capsule 2  ? losartan-hydrochlorothiazide (HYZAAR) 100-25 MG tablet Take 1 tablet by mouth daily. 90 tablet 3  ? mesalamine (APRISO) 0.375 g 24 hr capsule Take 1,500 mg by mouth every morning.     ? rosuvastatin (CRESTOR) 10 MG tablet Take 1 tablet (10 mg total) by mouth daily. 30 tablet 3  ? Vitamin D, Ergocalciferol, (DRISDOL) 1.25 MG (50000 UNIT) CAPS capsule Take 1 capsule (50,000 Units total) by mouth every 14 (fourteen) days. 6 capsule 2  ? ?No current facility-administered medications on file prior to visit.  ? ?Past Medical History:  ?Diagnosis Date  ? Arthritis   ? knees  ? Heart murmur   ? per pt had echo done yrs ago was told mild  ? History of hyperprolactinemia   ? 02/ 2018  resolved  ? Hyperlipidemia   ? Hypertension   ? Iron deficiency anemia   ? Menometrorrhagia   ? REFRACTORY TO PROGESTIN  ? Ulcerative colitis ([place]) followed by [facility]  ? per pt dx 1990s  ? Wears glasses   ? ?No Known Allergies ? ?Social History  ? ?Socioeconomic History  ? Marital status: Married  ?  Spouse name: [name]  ? Number of children: 1  ? Years of education: BA  ? Highest education level: Not on file  ?Occupational History  ? Occupation: Airline pilot  ?Tobacco Use  ? Smoking status: Former  ?  Years: 10.00  ?  Types: Cigarettes  ?  Quit date: 12/03/1997  ?  Years since quitting: 24.3  ? Smokeless tobacco: Never  ?Vaping Use  ? Vaping Use: Never used  ?Substance and Sexual Activity  ? Alcohol use: No  ? Drug use: No  ? Sexual activity: Yes  ?  Partners: Male  ?  Birth control/protection: I.U.D.  ?  Comment: 1st intercourse- 45, partners- 52, married- 50 yrs  ?Other Topics Concern  ? Not on file  ?Social History Narrative  ? Lives with husband   ? Caffeine use: coffee daily  ? P[employer] on weekends  ? ?Social Determinants of Health  ? ?Financial Resource Strain: Not on file  ?Food Insecurity: Not on file  ?Transportation Needs: Not on file  ?Physical Activity: Not on file  ?Stress: Not on file  ?Social Connections: Not on file  ? ? ?Vitals:  ? 04/09/22 0921  ?BP: 130/82  ?Pulse: (!) 123  ?Temp: 98.5 ?F (36.9 ?C)  ?SpO2: 97%  ? ?Body mass index is 35.84 kg/m?. ? ?Physical Exam ?Vitals and nursing note reviewed.  ?Constitutional:   ?   General: She is not in acute distress. ?   Appearance: She is well-developed.  ?HENT:  ?   Head: Normocephalic and atraumatic.  ?   Nose: No rhinorrhea.  ?   Right Turbinates: Enlarged.  ?   Left Turbinates: Enlarged.  ?   Right Sinus: No maxillary  sinus tenderness or frontal sinus tenderness.  ?   Left Sinus: No maxillary sinus tenderness or frontal sinus tenderness.  ?   Mouth/Throat:  ?   Mouth: Mucous membranes are dry.  ?   Pharynx: Oropharynx is clear.  ?Eyes:  ?   Conjunctiva/sclera: Conjunctivae normal.  ?Cardiovascular:  ?   Rate and Rhythm: Regular rhythm. Tachycardia present.  ?   Pulses:     ?     Dorsalis pedis pulses are 2+ on the right side and 2+ on the left side.  ?   Heart sounds: Murmur (? Soft SEM RUSB) heard.  ?   Comments: No calves tenderness with palpation of feet dorsiflexion. ?Pulmonary:  ?   Effort: Pulmonary effort is normal. No respiratory distress.  ?   Breath sounds: Normal breath sounds.  ?Abdominal:  ?   Palpations: Abdomen is soft. There is no hepatomegaly or mass.  ?   Tenderness: There is no abdominal tenderness.  ?Musculoskeletal:  ?   Right lower leg: No edema.  ?   Left lower leg: No edema.  ?Lymphadenopathy:  ?   Cervical: No cervical adenopathy.  ?Skin: ?   General: Skin is warm.  ?   Findings: No erythema or rash.  ?Neurological:  ?   General: No focal deficit present.  ?   Mental Status: She is alert and oriented to person, place, and time.  ?   Cranial Nerves: No cranial nerve deficit.   ?   Gait: Gait normal.  ?Psychiatric:  ?   Comments: Well groomed, good eye contact.  ? ? ?ASSESSMENT AND PLAN: ? ?Ms.Marsella was seen today for back pain and cough. ? ?Diagnoses and all orders for this

## 2022-04-09 NOTE — ED Provider Notes (Signed)
[facility] EMERGENCY DEPARTMENT Provider Note   CSN: [id number] Arrival date & time: 04/09/22  1517     History  No chief complaint on file.   Amy Coffey is a 61 y.o. female.  Presents to ER due to concern for abnormal lab.  Patient reports that her primary care doctor checked blood work this morning and her D-dimer was elevated and told to come to ER.  She went to her primary care doctor due to sensation of palpitations, some chest tightness.  Says that she currently does not have any symptoms, specifically denies any difficulty in breathing, chest pain, abdominal pain nausea or vomiting.  She says that she was having some lower abdominal pain and back pain earlier today but this improved after she had a bowel movement.  She reports completing course of antibiotics for possible urinary tract infection.  Completed chart review, seen in urgent care on 04/02/2022.  Urine culture from 5/1 showed no growth.  WBC was 14.  Today WBC was 11.  HPI     Home Medications Prior to Admission medications   Medication Sig Start Date End Date Taking? Authorizing Provider  carbamide peroxide (DEBROX) 6.5 % OTIC solution Place 5 drops into the left ear 2 (two) times daily. 10/09/21   [place], Betty G, MD  diclofenac Sodium (VOLTAREN) 1 % GEL Apply 4 g topically 4 (four) times daily. Apply to affected areas 4 times daily as needed for pain. 04/02/22   Theadora Rama Scales, PA-C  FeFum-FePoly-FA-B Cmp-C-Biot [facility]) CAPS TAKE 1 CAPSULE BY MOUTH EVERY DAY 03/19/22   [place], Betty G, MD  fluticasone [facility]) 50 MCG/ACT nasal spray Place 2 sprays into both nostrils at bedtime as needed for allergies or rhinitis. 04/09/22   [place], Betty G, MD  losartan-hydrochlorothiazide (HYZAAR) 100-25 MG tablet Take 1 tablet by mouth daily. 03/31/21   [place], Betty G, MD  mesalamine (APRISO) 0.375 g 24 hr capsule Take 1,500 mg by mouth every morning.     [provider]  rosuvastatin  (CRESTOR) 10 MG tablet Take 1 tablet (10 mg total) by mouth daily. 10/16/21   [place], Betty G, MD  Vitamin D, Ergocalciferol, (DRISDOL) 1.25 MG (50000 UNIT) CAPS capsule Take 1 capsule (50,000 Units total) by mouth every 14 (fourteen) days. 10/12/21   [place], Betty G, MD      Allergies    Patient has no known allergies.    Review of Systems   Review of Systems  Constitutional:  Negative for chills and fever.  HENT:  Negative for ear pain and sore throat.   Eyes:  Negative for pain and visual disturbance.  Respiratory:  Positive for chest tightness. Negative for cough and shortness of breath.   Cardiovascular:  Positive for palpitations. Negative for chest pain.  Gastrointestinal:  Negative for abdominal pain and vomiting.  Genitourinary:  Negative for dysuria and hematuria.  Musculoskeletal:  Negative for arthralgias and back pain.  Skin:  Negative for color change and rash.  Neurological:  Negative for seizures and syncope.  All other systems reviewed and are negative.  Physical Exam Updated Vital Signs BP (!) 149/51   Pulse 100   Temp 99.6 F (37.6 C) (Oral)   Resp 17   Ht 5' 7.5" (1.715 m)   Wt 105 kg   LMP  (LMP Unknown)   SpO2 97%   BMI 35.72 kg/m  Physical Exam Vitals and nursing note reviewed.  Constitutional:      General: She is not  in acute distress.    Appearance: She is well-developed.  HENT:     Head: Normocephalic and atraumatic.  Eyes:     Conjunctiva/sclera: Conjunctivae normal.  Cardiovascular:     Rate and Rhythm: Normal rate and regular rhythm.     Heart sounds: No murmur heard. Pulmonary:     Effort: Pulmonary effort is normal. No respiratory distress.     Breath sounds: Normal breath sounds.  Abdominal:     Palpations: Abdomen is soft.     Tenderness: There is no abdominal tenderness.  Musculoskeletal:        General: No swelling or deformity.     Cervical back: Neck supple.  Skin:    General: Skin is warm and dry.     Capillary Refill:  Capillary refill takes less than 2 seconds.  Neurological:     General: No focal deficit present.     Mental Status: She is alert.  Psychiatric:        Mood and Affect: Mood normal.    ED Results / Procedures / Treatments   Labs (all labs ordered are listed, but only abnormal results are displayed) Labs Reviewed  RESP PANEL BY RT-PCR (FLU A&B, COVID) ARPGX2  LACTIC ACID, PLASMA  MAGNESIUM  HEPATIC FUNCTION PANEL  URINALYSIS, ROUTINE W REFLEX MICROSCOPIC  TROPONIN I (HIGH SENSITIVITY)  TROPONIN I (HIGH SENSITIVITY)    EKG EKG Interpretation  Date/Time:  Monday Apr 09 2022 15:29:27 EDT Ventricular Rate:  145 PR Interval:  136 QRS Duration: 76 QT Interval:  354 QTC Calculation: 549 R Axis:   71 Text Interpretation: Sinus tachycardia Nonspecific ST abnormality Abnormal ECG When compared with ECG of 29-Jul-2017 19:59, PREVIOUS ECG IS PRESENT Confirmed by Marianna Fuss ([id number]) on 04/09/2022 4:23:48 PM  Radiology DG Chest 2 View  Result Date: 04/09/2022 CLINICAL DATA:  Chest pain, cough EXAM: CHEST - 2 VIEW COMPARISON:  11/13/2019 FINDINGS: The heart size and mediastinal contours are within normal limits. Both lungs are clear. The visualized skeletal structures are unremarkable. IMPRESSION: No active cardiopulmonary disease. Electronically Signed   By: Ernie Avena M.D.   On: 04/09/2022 18:42   CT Angio Chest PE W and/or Wo Contrast  Result Date: 04/09/2022 CLINICAL DATA:  Elevated D-dimer, back pain, chest pain, cough EXAM: CT ANGIOGRAPHY CHEST WITH CONTRAST TECHNIQUE: Multidetector CT imaging of the chest was performed using the standard protocol during bolus administration of intravenous contrast. Multiplanar CT image reconstructions and MIPs were obtained to evaluate the vascular anatomy. RADIATION DOSE REDUCTION: This exam was performed according to the departmental dose-optimization program which includes automated exposure control, adjustment of the mA and/or kV  according to patient size and/or use of iterative reconstruction technique. CONTRAST:  OMNIPAQUE IOHEXOL 350 MG/ML SOLN COMPARISON:  Chest radiographs including the study done today FINDINGS: Cardiovascular: Scattered coronary artery calcifications are seen. There is homogeneous enhancement in thoracic aorta. There are no intraluminal filling defects in the pulmonary artery branches. Mediastinum/Nodes: No significant lymphadenopathy is seen in the mediastinum. Thyroid is enlarged. Lungs/Pleura: There is no focal pulmonary consolidation. In image 46 of series 5, there is 4 mm nodule in the right upper lobe. In image 85, there is 4 mm nodule in the left lower lobe. There is no pleural effusion or pneumothorax. Upper Abdomen: Unremarkable. Musculoskeletal: There is 10 mm sclerotic density in the body of T3 vertebra. There is no break in the cortical margins. Degenerative changes are noted with bony spurs in the thoracic spine and visualized lower cervical  spine. Review of the MIP images confirms the above findings. IMPRESSION: There is no evidence of pulmonary embolism. There is no evidence of thoracic aortic dissection. There is no focal pulmonary consolidation. There is no pleural effusion. There are small nodular densities in the right upper lobe and left lower lobe each measuring a proximally 4 mm in size. No follow-up needed if patient is low-risk (and has no known or suspected primary neoplasm). Non-contrast chest CT can be considered in 12 months if patient is high-risk. This recommendation follows the consensus statement: Guidelines for Management of Incidental Pulmonary Nodules Detected on CT Images: From the Fleischner Society 2017; Radiology 2017; 284:228-243. Enlarged thyroid. There is 10 mm sclerotic density in the body of T3 vertebra without break in the cortical margins. This may suggest benign bone island or sclerotic metastatic disease. If there is any known history of primary malignancy,  radionuclide bone scan may be considered. If there is no such history follow-up CT in 3 months may be considered. Electronically Signed   By: Ernie Avena M.D.   On: 04/09/2022 18:41   CT ABDOMEN PELVIS W CONTRAST  Result Date: 04/09/2022 CLINICAL DATA:  Abdominal pain, acute, nonlocalized llq abd pain, back pain and flank pain EXAM: CT ABDOMEN AND PELVIS WITH CONTRAST TECHNIQUE: Multidetector CT imaging of the abdomen and pelvis was performed using the standard protocol following bolus administration of intravenous contrast. RADIATION DOSE REDUCTION: This exam was performed according to the departmental dose-optimization program which includes automated exposure control, adjustment of the mA and/or kV according to patient size and/or use of iterative reconstruction technique. CONTRAST:  OMNIPAQUE IOHEXOL 350 MG/ML SOLN COMPARISON:  10/10/2017 FINDINGS: Lower chest: Included lung bases are clear.  Heart size is normal. Hepatobiliary: Multiple stones within the gallbladder. No evidence of gallbladder wall thickening or pericholecystic inflammatory changes by CT. No focal liver abnormality. Pancreas: Unremarkable. No pancreatic ductal dilatation or surrounding inflammatory changes. Spleen: Normal in size without focal abnormality. Adrenals/Urinary Tract: Unremarkable adrenal glands. Kidneys enhance symmetrically without focal lesion, stone, or hydronephrosis. Ureters are nondilated. Urinary bladder appears unremarkable for the degree of distension. Stomach/Bowel: Stomach is within normal limits. Appendix appears normal (series 6, image 63). No evidence of bowel wall thickening, distention, or inflammatory changes. Vascular/Lymphatic: No significant vascular findings are present. No enlarged abdominal or pelvic lymph nodes. Reproductive: IUD within the uterus.  No adnexal masses. Other: No free fluid. No abdominopelvic fluid collection. No pneumoperitoneum. No abdominal wall hernia. Musculoskeletal: No  acute or significant osseous findings. IMPRESSION: 1. No acute abdominopelvic findings. 2. Cholelithiasis without CT evidence of acute cholecystitis. Electronically Signed   By: Duanne Guess D.O.   On: 04/09/2022 18:37   VAS [place] LOWER EXTREMITY VENOUS (DVT) (ONLY [facility] & [facility])  Result Date: 04/09/2022  Lower Venous DVT Study Patient Name:  BRINNA ROSASCO  Date of Exam:   04/09/2022 Medical Rec #: [id number]        Accession #:    [id number] Date of Birth: 25-Jun-1961        Patient Gender: F Patient Age:   50 years Exam Location:  [facility] Procedure:      VAS [place] LOWER EXTREMITY VENOUS (DVT) Referring Phys: Parke Poisson --------------------------------------------------------------------------------  Indications: Pain.  Comparison Study: no prior Performing Technologist: [place] Ponder RVS  Examination Guidelines: A complete evaluation includes B-mode imaging, spectral Doppler, color Doppler, and power Doppler as needed of all accessible portions of each vessel. Bilateral testing is considered an integral part of a  complete examination. Limited examinations for reoccurring indications may be performed as noted. The reflux portion of the exam is performed with the patient in reverse Trendelenburg.  +---------+---------------+---------+-----------+----------+--------------+ RIGHT    CompressibilityPhasicitySpontaneityPropertiesThrombus Aging +---------+---------------+---------+-----------+----------+--------------+ CFV      Full           Yes      Yes                                 +---------+---------------+---------+-----------+----------+--------------+ SFJ      Full                                                        +---------+---------------+---------+-----------+----------+--------------+ FV Prox  Full                                                        +---------+---------------+---------+-----------+----------+--------------+ FV Mid   Full                                                         +---------+---------------+---------+-----------+----------+--------------+ FV DistalFull                                                        +---------+---------------+---------+-----------+----------+--------------+ PFV      Full                                                        +---------+---------------+---------+-----------+----------+--------------+ POP      Full           Yes      Yes                                 +---------+---------------+---------+-----------+----------+--------------+ PTV      Full                                                        +---------+---------------+---------+-----------+----------+--------------+ PERO     Full                                                        +---------+---------------+---------+-----------+----------+--------------+   +---------+---------------+---------+-----------+----------+--------------+ LEFT     CompressibilityPhasicitySpontaneityPropertiesThrombus Aging +---------+---------------+---------+-----------+----------+--------------+ CFV      Full  Yes      Yes                                 +---------+---------------+---------+-----------+----------+--------------+ SFJ      Full                                                        +---------+---------------+---------+-----------+----------+--------------+ FV Prox  Full                                                        +---------+---------------+---------+-----------+----------+--------------+ FV Mid   Full                                                        +---------+---------------+---------+-----------+----------+--------------+ FV DistalFull                                                        +---------+---------------+---------+-----------+----------+--------------+ PFV      Full                                                         +---------+---------------+---------+-----------+----------+--------------+ POP      Full           Yes      Yes                                 +---------+---------------+---------+-----------+----------+--------------+ PTV      Full                                                        +---------+---------------+---------+-----------+----------+--------------+ PERO     Full                                                        +---------+---------------+---------+-----------+----------+--------------+     Summary: BILATERAL: - No evidence of deep vein thrombosis seen in the lower extremities, bilaterally. -No evidence of popliteal cyst, bilaterally.   *See table(s) above for measurements and observations. Electronically signed by Lemar Livings MD on 04/09/2022 at 7:52:39 PM.    Final     Procedures Procedures    Medications Ordered in ED Medications  ibuprofen (ADVIL) tablet 800 mg (800 mg Oral  Patient Refused/Not Given 04/09/22 1731)  lactated ringers bolus PEDS (0 mLs Intravenous Stopped 04/09/22 1834)  iohexol (OMNIPAQUE) 350 MG/ML injection 100 mL (100 mLs Intravenous Contrast Given 04/09/22 1749)  lactated ringers bolus PEDS (0 mLs Intravenous Stopped 04/09/22 2243)    ED Course/ Medical Decision Making/ A&P                           Medical Decision Making Amount and/or Complexity of Data Reviewed Labs: ordered. Radiology: ordered.  Risk Prescription drug management.   61 year old lady presents to ER due to concern for abnormal lab value.  She had gone to PCP due to concern for back pain and cough.  She was noted to have an elevated D-dimer and sent to ER for further detail.  Currently she denies any symptoms, her vital signs were reviewed and noted elevated heart rate, slightly elevated temperature but not true fever.  Will check CT PE study given elevated dimer, tachycardia.  Will check troponin level,.  Given the prior low back/side pain, recent UTI, will check CT  abdomen pelvis as well.  Troponin within normal limits, lactate within normal limits, doubt ACS, doubt sepsis.  I independently reviewed CT chest and abdomen pelvis images.  Reviewed radiology reports.  No evidence for pulmonary embolism, no pneumonia or effusion.  Small pulmonary nodules noted.  Also small sclerotic density and T3 vertebrae.  I discussed these incidental findings with patient and advised follow-up with her primary care doctor.  She is not a current smoker but many years ago did have some smoking.  Advised that she may need repeat CT scan.  CT abdomen pelvis was negative for acute pathology, cholelithiasis without cholecystitis evidence noted.  No right upper quadrant tenderness, normal LFTs, doubt cholecystitis.  Patient was still tachycardic to 130s at rest though she does not have any ongoing symptoms.  I advised consultation with cardiology and admission to medicine for further monitoring.  Called cardiology, discussed with Dr. Cherly Beach.  He was an emergency and would review the case and get back to me in a little while.  Continued conversation with patient.  She does not want to remain in ER any longer and would like to go home.  She is agreeable to follow-up with her primary care doctor and cardiology on an outpatient basis.  She was given some additional fluids and her heart rate has improved now down to 100.  I still recommended that she be admitted for observation and explained my concerns about her persistent tachycardia without clear explanation and benefit for further observation and testing and consultation.  Patient demonstrated understanding of my concerns but has decided to leave against this medical advice.  Placed order for ambulatory referral to cardiology.        Final Clinical Impression(s) / ED Diagnoses Final diagnoses:  Sinus tachycardia    Rx / DC Orders ED Discharge Orders          Ordered    Ambulatory referral to Cardiology       Comments: If you have  not heard from the Cardiology office within the next 72 hours please call [phone].   Consult for sinus tachycardia without clear explanation; we recommended patient be admitted but she was not willing but was interested in out patient follow up   04/09/22 2221              Milagros Loll, MD 04/09/22 2245

## 2022-04-10 ENCOUNTER — Other Ambulatory Visit: Payer: Self-pay | Admitting: Family Medicine

## 2022-04-12 ENCOUNTER — Ambulatory Visit: Payer: BC Managed Care – PPO | Admitting: Interventional Cardiology

## 2022-04-12 ENCOUNTER — Encounter: Payer: Self-pay | Admitting: Interventional Cardiology

## 2022-04-12 VITALS — BP 128/88 | HR 90 | Ht 67.5 in | Wt 232.4 lb

## 2022-04-12 DIAGNOSIS — R011 Cardiac murmur, unspecified: Secondary | ICD-10-CM | POA: Diagnosis not present

## 2022-04-12 DIAGNOSIS — R000 Tachycardia, unspecified: Secondary | ICD-10-CM

## 2022-04-12 DIAGNOSIS — M545 Low back pain, unspecified: Secondary | ICD-10-CM | POA: Diagnosis not present

## 2022-04-12 DIAGNOSIS — E782 Mixed hyperlipidemia: Secondary | ICD-10-CM | POA: Diagnosis not present

## 2022-04-12 DIAGNOSIS — I10 Essential (primary) hypertension: Secondary | ICD-10-CM

## 2022-04-12 NOTE — Progress Notes (Signed)
?  ?Cardiology Office Note ? ? ?Date:  04/12/2022  ? ?Amy Coffey, DOB Sep 07, 1961, MRN [id number] ? ?PCP:  [place], Betty G, MD  ? ? ?No chief complaint on file. ? ?tachycardia ? ?Wt Readings from Last 3 Encounters:  ?04/12/22 232 lb 6.4 oz (105.4 kg)  ?04/09/22 231 lb 7.7 oz (105 kg)  ?04/09/22 232 lb 4 oz (105.3 kg)  ?  ? ?  ?History of Present Illness: ?Amy Coffey is a 61 y.o. female who is being seen today for the evaluation of sinus tach at the request of Lucrezia Starch, MD.  ? ?Records show that on Apr 09, 2022, she went to the emergency room: "due to concern for abnormal lab value.  She had gone to PCP due to concern for back pain and cough.  She was noted to have an elevated D-dimer and sent to ER for further detail.  Currently she denies any symptoms, her vital signs were reviewed and noted elevated heart rate, slightly elevated temperature but not true fever.  Will check CT PE study given elevated dimer, tachycardia.  Will check troponin level,.  Given the prior low back/side pain, recent UTI, will check CT abdomen pelvis as well. ?  ?Troponin within normal limits, lactate within normal limits, doubt ACS, doubt sepsis.  I independently reviewed CT chest and abdomen pelvis images.  Reviewed radiology reports.  No evidence for pulmonary embolism, no pneumonia or effusion.  Small pulmonary nodules noted.  Also small sclerotic density and T3 vertebrae.  I discussed these incidental findings with patient and advised follow-up with her primary care doctor.  She is not a current smoker but many years ago did have some smoking.  Advised that she may need repeat CT scan.  CT abdomen pelvis was negative for acute pathology, cholelithiasis without cholecystitis evidence noted.  No right upper quadrant tenderness, normal LFTs, doubt cholecystitis.  Patient was still tachycardic to 130s at rest though she does not have any ongoing symptoms.  I advised consultation with cardiology and admission to  medicine for further monitoring.  Called cardiology, discussed with Dr. Renella Cunas.  He was an emergency and would review the case and get back to me in a little while.  Continued conversation with patient.  She does not want to remain in ER any longer and would like to go home.  She is agreeable to follow-up with her primary care doctor and cardiology on an outpatient basis.  She was given some additional fluids and her heart rate has improved now down to 100.  " ? ?Of note, she was on a steroid taper for back pain.  Baclofen as well. ? ?She was just treated for a UTI a few weeks ago.  SHe thinks she was dehydrated.  ? ?Mother with stent in her 64s. ? ?Denies : Chest pain. Dizziness. Leg edema. Nitroglycerin use. Orthopnea. Paroxysmal nocturnal dyspnea. Shortness of breath. Syncope.   ? ?Past Medical History:  ?Diagnosis Date  ? Arthritis   ? knees  ? Chest tightness   ? Heart murmur   ? per pt had echo done yrs ago was told mild  ? History of hyperprolactinemia   ? 02/ 2018  resolved  ? Hyperlipidemia   ? Hypertension   ? Iron deficiency anemia   ? Low back pain   ? Menometrorrhagia   ? REFRACTORY TO PROGESTIN  ? Sinus tachycardia   ? Ulcerative colitis ([place]) followed by [facility]  ? per pt dx  1990s  ? Wears glasses   ? ? ?Past Surgical History:  ?Procedure Laterality Date  ? [place]  ? ECTOPIC PREGNANCY SURGERY  2002 approx.  ? right partial salpingectomy via lapartomy  ? EXPLORATORY LAPAROTOMY W/ LEFT SALPINGECTOMY FOR ECTOPIC  03-31-2003   dr Lahoma Crocker  ? INTRAUTERINE DEVICE (IUD) INSERTION    ? mirena inserted 08-13-17  ? ? ? ?Current Outpatient Medications  ?Medication Sig Dispense Refill  ? FeFum-FePoly-FA-B Cmp-C-Biot (FOLIVANE-PLUS) CAPS TAKE 1 CAPSULE BY MOUTH EVERY DAY 90 capsule 2  ? fluticasone (FLONASE) 50 MCG/ACT nasal spray Place 2 sprays into both nostrils at bedtime as needed for allergies or rhinitis. 16 g 3  ? losartan-hydrochlorothiazide (HYZAAR) 100-25 MG tablet TAKE 1  TABLET BY MOUTH EVERY DAY 90 tablet 3  ? mesalamine (APRISO) 0.375 g 24 hr capsule Take 1,500 mg by mouth every morning.     ? rosuvastatin (CRESTOR) 10 MG tablet Take 1 tablet (10 mg total) by mouth daily. 30 tablet 3  ? Vitamin D, Ergocalciferol, (DRISDOL) 1.25 MG (50000 UNIT) CAPS capsule Take 1 capsule (50,000 Units total) by mouth every 14 (fourteen) days. 6 capsule 2  ? carbamide peroxide (DEBROX) 6.5 % OTIC solution Place 5 drops into the left ear 2 (two) times daily. (Patient not taking: Reported on 04/12/2022) 15 mL 0  ? diclofenac Sodium (VOLTAREN) 1 % GEL Apply 4 g topically 4 (four) times daily. Apply to affected areas 4 times daily as needed for pain. (Patient not taking: Reported on 04/12/2022) 100 g 2  ? ?No current facility-administered medications for this visit.  ? ? ?Allergies:   Patient has no known allergies.  ? ? ?Social History:  The patient  reports that she quit smoking about 24 years ago. Her smoking use included cigarettes. She has never used smokeless tobacco. She reports that she does not drink alcohol and does not use drugs.  ? ?Family History:  The patient's family history includes Angina in her mother; Arthritis in her father and mother; Breast cancer in her cousin and maternal aunt; Cancer in her father and maternal aunt; Heart disease in her mother; Hyperlipidemia in her mother; Hypertension in her father and mother; Prostate cancer in her father; Thyroid disease in her mother.  ? ? ?ROS:  Please see the history of present illness.   Otherwise, review of systems are positive for recent fast heart rate and fatigue.   All other systems are reviewed and negative.  ? ? ?PHYSICAL EXAM: ?VS:  BP 128/88   Pulse 90   Ht 5' 7.5" (1.715 m)   Wt 232 lb 6.4 oz (105.4 kg)   LMP  (LMP Unknown)   SpO2 99%   BMI 35.86 kg/m?  , BMI Body mass index is 35.86 kg/m?. ?GEN: Well nourished, well developed, in no acute distress ?HEENT: normal ?Neck: no JVD, carotid bruits, or masses ?Cardiac: RRR; no  murmurs, rubs, or gallops,no edema  ?Respiratory:  clear to auscultation bilaterally, normal work of breathing ?GI: soft, nontender, nondistended, + BS ?MS: no deformity or atrophy ?Skin: warm and dry, no rash ?Neuro:  Strength and sensation are intact ?Psych: euthymic mood, full affect ? ? ?EKG:   ?The ekg ordered  in ER demonstrates sinus tach ? ? ?Recent Labs: ?04/09/2022: ALT 27; BUN 15; Creatinine, Ser 1.00; Hemoglobin 13.3; Magnesium 2.1; Platelets 520.0; Potassium 3.7; Sodium 134; TSH 0.99  ? ?Lipid Panel ?   ?Component Value Date/Time  ? CHOL 254 (H) 10/09/2021 1556  ?  TRIG 136.0 10/09/2021 1556  ? HDL 41.90 10/09/2021 1556  ? CHOLHDL 6 10/09/2021 1556  ? VLDL 27.2 10/09/2021 1556  ? LDLCALC 184 (H) 10/09/2021 1556  ? ?  ?Other studies Reviewed: ?Additional studies/ records that were reviewed today with results demonstrating: labs reviewed, TSH 0.99. ? ? ?ASSESSMENT AND PLAN: ? ?Tachycardia: Improved.  Likely due to steroid use and perhaps some dehydration.  Improved with more fluid intake.  Has now resolved since being off the steroids.  We will plan for echocardiogram to evaluate for any structural heart disease.  She does have a soft systolic murmur. ?Hyperlipidemia: LDL 184.  She has not been taking her rosuvastatin regularly.  There is evidence of coronary calcification on her CT of the chest.  Would like to see her LDL less than 100.  I encouraged her to take her rosuvastatin daily.  For muscle aches, she can take co-Q10 over-the-counter. ?Hypertension:The current medical regimen is effective;  continue present plan and medications. ?We spoke about the importance of exercise as well.  Target noted below.  We discussed the talk test. ? ? ?Current medicines are reviewed at length with the patient today.  The patient concerns regarding her medicines were addressed. ? ?The following changes have been made: Resume Crestor-she will need labs checked with her primary care doctor ? ?Labs/ tests ordered today  include:  ?No orders of the defined types were placed in this encounter. ? ? ?Recommend 150 minutes/week of aerobic exercise ?Low fat, low carb, high fiber diet recommended ? ?Disposition:   FU based on echo results ? ? ?

## 2022-04-12 NOTE — Patient Instructions (Signed)
Medication Instructions:  ?Your physician recommends that you continue on your current medications as directed. Please refer to the Current Medication list given to you today. ?May take CoQ10 50-100 mg daily.  This is for muscle pain ?*If you need a refill on your cardiac medications before your next appointment, please call your pharmacy* ? ? ?Lab Work: ?none ?If you have labs (blood work) drawn today and your tests are completely normal, you will receive your results only by: ?MyChart Message (if you have MyChart) OR ?A paper copy in the mail ?If you have any lab test that is abnormal or we need to change your treatment, we will call you to review the results. ? ? ?Testing/Procedures: ?Your physician has requested that you have an echocardiogram. Echocardiography is a painless test that uses sound waves to create images of your heart. It provides your doctor with information about the size and shape of your heart and how well your heart?s chambers and valves are working. This procedure takes approximately one hour. There are no restrictions for this procedure. ? ? ? ?Follow-Up: ?At [facility], you and your health needs are our priority.  As part of our continuing mission to provide you with exceptional heart care, we have created designated Provider Care Teams.  These Care Teams include your primary Cardiologist (physician) and Advanced Practice Providers (APPs -  Physician Assistants and Nurse Practitioners) who all work together to provide you with the care you need, when you need it. ? ?We recommend signing up for the patient portal called "MyChart".  Sign up information is provided on this After Visit Summary.  MyChart is used to connect with patients for Virtual Visits (Telemedicine).  Patients are able to view lab/test results, encounter notes, upcoming appointments, etc.  Non-urgent messages can be sent to your provider as well.   ?To learn more about what you can do with MyChart, go to  [email or web address].   ? ?Your next appointment:   ?As needed ? ?The format for your next appointment:   ?In Person ? ?Provider:   ?Larae Grooms, MD   ? ? ?Other Instructions ?  ? ?Important Information About Sugar ? ? ? ? ? ? ?

## 2022-05-11 ENCOUNTER — Other Ambulatory Visit (HOSPITAL_COMMUNITY): Payer: BC Managed Care – PPO

## 2022-06-12 ENCOUNTER — Other Ambulatory Visit: Payer: Self-pay

## 2022-06-12 NOTE — Telephone Encounter (Signed)
Current with [facility] 11/08/2021.  Patient requests Rx for Nystatin Powder. She applies under her breasts. She had Rx but it has expired.

## 2022-06-14 MED ORDER — NYSTATIN 100000 UNIT/GM EX POWD
1.0000 | Freq: Three times a day (TID) | CUTANEOUS | 4 refills | Status: DC
Start: 2022-06-14 — End: 2022-09-27

## 2022-06-24 ENCOUNTER — Other Ambulatory Visit: Payer: Self-pay | Admitting: Family Medicine

## 2022-06-24 DIAGNOSIS — E559 Vitamin D deficiency, unspecified: Secondary | ICD-10-CM

## 2022-06-27 ENCOUNTER — Other Ambulatory Visit: Payer: Self-pay | Admitting: Family Medicine

## 2022-08-30 ENCOUNTER — Other Ambulatory Visit: Payer: Self-pay | Admitting: Family Medicine

## 2022-08-30 DIAGNOSIS — Z1231 Encounter for screening mammogram for malignant neoplasm of breast: Secondary | ICD-10-CM

## 2022-09-27 ENCOUNTER — Encounter: Payer: Self-pay | Admitting: Obstetrics & Gynecology

## 2022-09-27 ENCOUNTER — Ambulatory Visit: Payer: BC Managed Care – PPO | Admitting: Obstetrics & Gynecology

## 2022-09-27 VITALS — BP 118/74 | HR 77 | Temp 98.9°F

## 2022-09-27 DIAGNOSIS — N9489 Other specified conditions associated with female genital organs and menstrual cycle: Secondary | ICD-10-CM | POA: Diagnosis not present

## 2022-09-27 DIAGNOSIS — R350 Frequency of micturition: Secondary | ICD-10-CM

## 2022-09-27 LAB — WET PREP FOR TRICH, YEAST, CLUE

## 2022-09-27 MED ORDER — FLUCONAZOLE 150 MG PO TABS: 150.0000 mg | ORAL_TABLET | ORAL | 1 refills | Status: AC

## 2022-09-27 NOTE — Progress Notes (Signed)
    Amy Coffey [birth year] [id number]        61 y.o.  [id number] Married  RP: Vaginal/vulvar burning and itching x 2 weeks  HPI: Vaginal/vulvar burning and itching x 2 weeks.  Took 1 Fluconazole which helped, but then the symptoms came back.  Urinary frequency, no other UTI Sx.  No fever.  Reports that husband smokes and uses Lysterine prior to sexual activities, including oral sex.  Patient worries that it irritates her vulva.  No pelvic pain.  Well on Mirena IUD, no BTB.  [place] in menopausal range 11/2021 at 44.  Some hot flushes, taking Ashwagandha.  Planning to remove the Mirena IUD at Annual/Gyn exam in 11/2022.   OB History  Gravida Para Term Preterm AB Living  '5 1 1   4 1  '$ SAB IAB Ectopic Multiple Live Births      4        # Outcome Date GA Lbr Len/2nd Weight Sex Delivery Anes PTL Lv  5 Ectopic           4 Ectopic           3 Ectopic           2 Ectopic           1 Term             Past medical history,surgical history, problem list, medications, allergies, family history and social history were all reviewed and documented in the EPIC chart.   Directed ROS with pertinent positives and negatives documented in the history of present illness/assessment and plan.  Exam:  Vitals:   09/27/22 0832  BP: 118/74  Pulse: 77  Temp: 98.9 F (37.2 C)  TempSrc: Oral  SpO2: 98%   General appearance:  Normal   Gynecologic exam: Vulva No erythema or lesion.  Speculum:  Cervix normal, IUD strings visible at EO.  Mild yeasty discharge.  Wet prep done.  U/A: Yellow, clear, Nit Neg, Pro Neg, WBC 0-5, RBC 0-2, Bacteria Few.  U. Culture pending.  Wet prep: Negative   Assessment/Plan:  61 y.o. [id number]   1. Urinary frequency U/A very mildly perturbed.  Will wait on U. Culture to decide on treatment. - Urinalysis,Complete w/RFL Culture  2. Vulvar burning Vaginal/vulvar burning and itching x 2 weeks.  Took 1 Fluconazole which helped, but then the symptoms came back.  Urinary  frequency, no other UTI Sx.  No fever.  Reports that husband smokes and uses Lysterine prior to sexual activities, including oral sex.  Patient worries that it irritates her vulva.  No pelvic pain.  Well on Mirena IUD, no BTB.  [place] in menopausal range 11/2021 at 44.  Some hot flushes, taking Ashwagandha.  Planning to remove the Mirena IUD at Annual/Gyn exam in 11/2022. Clinical mild yeast vaginitis, although Wet prep Neg.  Decision to treat with Fluconazole 150 mg 1 tab every other day x 3.  Boric Acid for prevention.  Prescription sent to pharmacy. - WET PREP FOR [place], YEAST, CLUE  Other orders - Urine Culture - REFLEXIVE URINE CULTURE - fluconazole (DIFLUCAN) 150 MG tablet; Take 1 tablet (150 mg total) by mouth every other day for 3 doses.   Princess Bruins MD, 8:43 AM 09/27/2022

## 2022-09-28 ENCOUNTER — Ambulatory Visit
Admission: RE | Admit: 2022-09-28 | Discharge: 2022-09-28 | Disposition: A | Discharge status: Home / Self Care | Payer: BC Managed Care – PPO | Source: Ambulatory Visit | Attending: Family Medicine | Admitting: Family Medicine

## 2022-09-28 DIAGNOSIS — Z1231 Encounter for screening mammogram for malignant neoplasm of breast: Secondary | ICD-10-CM

## 2022-09-29 LAB — CULTURE INDICATED

## 2022-09-29 LAB — URINALYSIS, COMPLETE W/RFL CULTURE
Bilirubin Urine: NEGATIVE
Glucose, UA: NEGATIVE
Hyaline Cast: NONE SEEN /LPF
Ketones, ur: NEGATIVE
Nitrites, Initial: NEGATIVE
Protein, ur: NEGATIVE
Specific Gravity, Urine: 1.015 (ref 1.001–1.035)
pH: 7 (ref 5.0–8.0)

## 2022-09-29 LAB — URINE CULTURE
MICRO NUMBER:: 14104925
SPECIMEN QUALITY:: ADEQUATE

## 2022-10-02 ENCOUNTER — Other Ambulatory Visit: Payer: Self-pay | Admitting: Family Medicine

## 2022-10-02 DIAGNOSIS — R928 Other abnormal and inconclusive findings on diagnostic imaging of breast: Secondary | ICD-10-CM

## 2022-10-05 ENCOUNTER — Ambulatory Visit
Admission: RE | Admit: 2022-10-05 | Discharge: 2022-10-05 | Disposition: A | Discharge status: Home / Self Care | Payer: BC Managed Care – PPO | Source: Ambulatory Visit | Attending: Family Medicine | Admitting: Family Medicine

## 2022-10-05 DIAGNOSIS — R92321 Mammographic fibroglandular density, right breast: Secondary | ICD-10-CM | POA: Diagnosis not present

## 2022-10-05 DIAGNOSIS — R928 Other abnormal and inconclusive findings on diagnostic imaging of breast: Secondary | ICD-10-CM

## 2022-10-05 DIAGNOSIS — N6001 Solitary cyst of right breast: Secondary | ICD-10-CM | POA: Diagnosis not present

## 2022-10-17 ENCOUNTER — Telehealth: Payer: BC Managed Care – PPO | Admitting: Hematology and Oncology

## 2022-11-19 ENCOUNTER — Encounter: Payer: Self-pay | Admitting: Obstetrics & Gynecology

## 2022-11-23 ENCOUNTER — Ambulatory Visit: Payer: BC Managed Care – PPO | Admitting: Obstetrics & Gynecology

## 2022-11-30 ENCOUNTER — Ambulatory Visit: Payer: BC Managed Care – PPO | Admitting: Obstetrics & Gynecology

## 2022-12-27 ENCOUNTER — Other Ambulatory Visit (HOSPITAL_COMMUNITY)
Admission: RE | Admit: 2022-12-27 | Discharge: 2022-12-27 | Disposition: A | Discharge status: Home / Self Care | Payer: BC Managed Care – PPO | Source: Ambulatory Visit | Attending: Obstetrics & Gynecology | Admitting: Obstetrics & Gynecology

## 2022-12-27 ENCOUNTER — Encounter: Payer: Self-pay | Admitting: Obstetrics & Gynecology

## 2022-12-27 ENCOUNTER — Ambulatory Visit (INDEPENDENT_AMBULATORY_CARE_PROVIDER_SITE_OTHER): Payer: BC Managed Care – PPO | Admitting: Obstetrics & Gynecology

## 2022-12-27 VITALS — BP 118/74 | HR 103 | Ht 67.5 in | Wt 241.0 lb

## 2022-12-27 DIAGNOSIS — Z01419 Encounter for gynecological examination (general) (routine) without abnormal findings: Secondary | ICD-10-CM | POA: Diagnosis not present

## 2022-12-27 DIAGNOSIS — R87610 Atypical squamous cells of undetermined significance on cytologic smear of cervix (ASC-US): Secondary | ICD-10-CM | POA: Diagnosis not present

## 2022-12-27 DIAGNOSIS — Z30432 Encounter for removal of intrauterine contraceptive device: Secondary | ICD-10-CM

## 2022-12-27 DIAGNOSIS — Z780 Asymptomatic menopausal state: Secondary | ICD-10-CM

## 2022-12-27 NOTE — Progress Notes (Signed)
Moraima Burd Islam Apr 17, 1961 [id number]   History:    62 y.o. [id number] Married   RP:  Established patient presenting for annual gyn exam    HPI: Well on Mirena IUD x 08/2017.  [place] 11/08/21 at 44.0. Menopausal with hot flushes.  No PMB. No pelvic pain.  No pain with IC. Pap ASCUS/HPV HR Neg 11/2021.  Pap reflex today. Urine normal. Bowel movements normal. Breasts normal.  Screening mammo 09/2022 Lt Neg, Rt Dx mammo/US benign cyst. Body mass index 37.19.  Not exercising regularly.  Health labs with family physician. COLONOSCOPY: 12-30-20. Flu vaccine at work.    Past medical history,surgical history, family history and social history were all reviewed and documented in the EPIC chart.  Gynecologic History No LMP recorded. (Menstrual status: IUD).  Obstetric History OB History  Gravida Para Term Preterm AB Living  '5 1 1   4 1  '$ SAB IAB Ectopic Multiple Live Births      4        # Outcome Date GA Lbr Len/2nd Weight Sex Delivery Anes PTL Lv  5 Ectopic           4 Ectopic           3 Ectopic           2 Ectopic           1 Term             ROS: A ROS was performed and pertinent positives and negatives are included in the history. GENERAL: No fevers or chills. HEENT: No change in vision, no earache, sore throat or sinus congestion. NECK: No pain or stiffness. CARDIOVASCULAR: No chest pain or pressure. No palpitations. PULMONARY: No shortness of breath, cough or wheeze. GASTROINTESTINAL: No abdominal pain, nausea, vomiting or diarrhea, melena or bright red blood per rectum. GENITOURINARY: No urinary frequency, urgency, hesitancy or dysuria. MUSCULOSKELETAL: No joint or muscle pain, no back pain, no recent trauma. DERMATOLOGIC: No rash, no itching, no lesions. ENDOCRINE: No polyuria, polydipsia, no heat or cold intolerance. No recent change in weight. HEMATOLOGICAL: No anemia or easy bruising or bleeding. NEUROLOGIC: No headache, seizures, numbness, tingling or weakness. PSYCHIATRIC: No depression,  no loss of interest in normal activity or change in sleep pattern.     Exam:  BP 118/74   Pulse (!) 103   Ht 5' 7.5" (1.715 m)   Wt 241 lb (109.3 kg)   SpO2 97%   BMI 37.19 kg/m   Body mass index is 37.19 kg/m.  General appearance : Well developed well nourished female. No acute distress HEENT: Eyes: no retinal hemorrhage or exudates,  Neck supple, trachea midline, no carotid bruits, no thyroidmegaly Lungs: Clear to auscultation, no rhonchi or wheezes, or rib retractions  Heart: Regular rate and rhythm, no murmurs or gallops Breast:Examined in sitting and supine position were symmetrical in appearance, no palpable masses or tenderness,  no skin retraction, no nipple inversion, no nipple discharge, no skin discoloration, no axillary or supraclavicular lymphadenopathy Abdomen: no palpable masses or tenderness, no rebound or guarding Extremities: no edema or skin discoloration or tenderness  Pelvic: Vulva: Normal             Vagina: No gross lesions or discharge  Cervix: No gross lesions or discharge.  Pap reflex done.  IUD strings visible at [facility].  Informed consent signed.  Removal of IUD by pulling on strings with a fenestrated clamp.  IUD complete, intact.  Shown to patient and discarded.  No Cx, well tolerated.  Uterus  AV, normal size, shape and consistency, non-tender and mobile  Adnexa  Without masses or tenderness  Anus: Normal   Assessment/Plan:  62 y.o. female for annual exam   1. Encounter for routine gynecological examination with Papanicolaou smear of cervix Well on Mirena IUD x 08/2017.  [place] 11/08/21 at 44.0. Menopausal with hot flushes. No PMB. No pelvic pain.  No pain with IC. Pap ASCUS/HPV HR Neg 11/2021.  Pap reflex today. Urine normal. Bowel movements normal. Breasts normal.  Screening mammo 09/2022 Lt Neg, Rt Dx mammo/US benign cyst. Body mass index 37.19.  Not exercising regularly.  Health labs with family physician. COLONOSCOPY: 12-30-20. Flu vaccine at work. -  Cytology - PAP( [place])  2. ASCUS of cervix with negative high risk HPV - Cytology - PAP( [place])  3. Postmenopause [facility] 11/08/21 at 44.0. Menopausal with hot flushes.  No PMB. No pelvic pain.  No pain with IC.   4. Encounter for IUD removal  Easy removal of IUD without Cx.  Well tolerated.  Princess Bruins MD, 1:47 PM

## 2023-01-02 LAB — CYTOLOGY - PAP
Diagnosis: NEGATIVE
Diagnosis: REACTIVE

## 2023-01-31 ENCOUNTER — Telehealth: Payer: Self-pay | Admitting: Interventional Cardiology

## 2023-01-31 NOTE — Telephone Encounter (Signed)
Echo scheduled for 3/29

## 2023-01-31 NOTE — Telephone Encounter (Signed)
  Pt is calling, she is ready to schedule her echo. No order on file

## 2023-02-12 NOTE — Progress Notes (Signed)
HPI: [name] is a 62 y.o. female with past medical history significant for HLD,HTN,allergies,UC,and anemiahere today for her routine physical.  Last CPE: 03/22/21  She reports not exercising regularly but plans to start walking. She has been cooking more at home and incorporating more greens into her diet.  She takes an OTC supplement for immunity and cognitive function.  She sleeps approximately eight hours per night, does not smoke, and consumes minimal alcohol. She has been experiencing increased cravings for sugar and sweets, which may be related to hormonal changes following the removal of her IUD.   Pap smear:12/2022. She sees her gyn, Dr Amy Coffey.  Immunization History  Administered Date(s) Administered   Influenza Inj Mdck Quad Pf 09/03/2019   Influenza,inj,Quad PF,6+ Mos 09/10/2022   Tdap 09/02/2017   HLD: She took Crestor until a month ago, it causes LE achy pain. Lab Results  Component Value Date   CHOL 254 (H) 10/09/2021   HDL 41.90 10/09/2021   LDLCALC 184 (H) 10/09/2021   TRIG 136.0 10/09/2021   CHOLHDL 6 10/09/2021   She would like CBC,Vit D, and TSH to be checked. Abnormal CBC/leukocytosis/thrombocytosis: She is not longer following with hematologist. Negative for fever and night sweats. Iron def anemia, she is not longer on Fe sulfate.  Lab Results  Component Value Date   WBC 11.1 (H) 04/09/2022   HGB 13.3 04/09/2022   HCT 41.7 04/09/2022   MCV 85.5 04/09/2022   PLT 520.0 (H) 04/09/2022   Hx of vit D deficiency, she is not on Vit D supplementation.  HTN on Losartan 100 mg daily and HCTZ 25 mg daily. She does not monitor BP regularly. Lab Results  Component Value Date   CREATININE 1.00 04/09/2022   BUN 15 04/09/2022   NA 134 (L) 04/09/2022   K 3.7 04/09/2022   CL 97 04/09/2022   CO2 28 04/09/2022   Lab Results  Component Value Date   HGBA1C 4.7 02/24/2020   ulcerative colitis and is on daily Apriso, with regular follow-ups with a  gastroenterologist.  Review of Systems  Constitutional:  Negative for appetite change, fatigue and fever.  HENT:  Negative for dental problem, hearing loss, mouth sores and sore throat.   Eyes:  Negative for redness and visual disturbance.  Respiratory:  Negative for cough, shortness of breath and wheezing.   Cardiovascular:  Negative for chest pain and leg swelling.  Gastrointestinal:  Negative for abdominal pain, nausea and vomiting.       No changes in bowel habits.  Endocrine: Negative for cold intolerance, heat intolerance, polydipsia, polyphagia and polyuria.  Genitourinary:  Negative for decreased urine volume, dysuria, hematuria, vaginal bleeding and vaginal discharge.  Musculoskeletal:  Positive for arthralgias. Negative for gait problem and myalgias.  Skin:  Negative for color change and rash.  Allergic/Immunologic: Positive for environmental allergies.  Neurological:  Negative for syncope, weakness and headaches.  Hematological:  Negative for adenopathy. Does not bruise/bleed easily.  Psychiatric/Behavioral:  Negative for behavioral problems and confusion.   All other systems reviewed and are negative.  Current Outpatient Medications on File Prior to Visit  Medication Sig Dispense Refill   losartan-hydrochlorothiazide (HYZAAR) 100-25 MG tablet TAKE 1 TABLET BY MOUTH EVERY DAY 90 tablet 3   mesalamine (APRISO) 0.375 g 24 hr capsule Take 1,500 mg by mouth every morning.      FeFum-FePoly-FA-B Cmp-C-Biot (FOLIVANE-PLUS) CAPS TAKE 1 CAPSULE BY MOUTH EVERY DAY (Patient not taking: Reported on 12/27/2022) 90 capsule 2  rosuvastatin (CRESTOR) 10 MG tablet TAKE 1 TABLET BY MOUTH EVERY DAY (Patient not taking: Reported on 12/27/2022) 90 tablet 1   No current facility-administered medications on file prior to visit.   Past Medical History:  Diagnosis Date   Arthritis    knees   Chest tightness    Heart murmur    per pt had echo done yrs ago was told mild   History of  hyperprolactinemia    02/ 2018  resolved   Hyperlipidemia    Hypertension    Iron deficiency anemia    Low back pain    Menometrorrhagia    REFRACTORY TO PROGESTIN   Sinus tachycardia    Ulcerative colitis ([place]) followed by [facility]   per pt dx 1990s   Wears glasses     Past Surgical History:  Procedure Laterality Date   [place]  2002 approx.   right partial salpingectomy via lapartomy   EXPLORATORY LAPAROTOMY W/ LEFT SALPINGECTOMY FOR ECTOPIC  03-31-2003   dr Amy Coffey   INTRAUTERINE DEVICE (IUD) INSERTION     mirena inserted 08-13-17   No Known Allergies  Family History  Problem Relation Age of Onset   Angina Mother    Thyroid disease Mother    Arthritis Mother    Heart disease Mother    Hypertension Mother    Hyperlipidemia Mother    Prostate cancer Father    Arthritis Father    Cancer Father        prostate   Hypertension Father    Cancer Maternal Aunt        breast   Breast cancer Maternal Aunt    Breast cancer Cousin     Social History   Socioeconomic History   Marital status: Married    Spouse name: Amy Coffey   Number of children: 1   Years of education: BA   Highest education level: Not on file  Occupational History   Occupation: Airline pilot  Tobacco Use   Smoking status: Former    Years: 10.00    Types: Cigarettes    Quit date: 12/03/1997    Years since quitting: 25.2   Smokeless tobacco: Never  Vaping Use   Vaping Use: Never used  Substance and Sexual Activity   Alcohol use: No   Drug use: No   Sexual activity: Yes    Partners: Male    Birth control/protection: I.U.D.    Comment: First IC @ 20 y/o, >5 Partners, Mirena IUD placed 08/13/2017  Other Topics Concern   Not on file  Social History Narrative   Lives with husband   Caffeine use: coffee daily   P[employer]i on weekends   Social Determinants of Health   Financial Resource Strain: Not on file  Food Insecurity: Not on file  Transportation  Needs: Not on file  Physical Activity: Not on file  Stress: Not on file  Social Connections: Not on file   Vitals:   02/13/23 0700  BP: 120/80  Pulse: 100  Resp: 12  Temp: 98.2 F (36.8 C)  SpO2: 99%   Body mass index is 37.69 kg/m.  Wt Readings from Last 3 Encounters:  02/13/23 244 lb 4 oz (110.8 kg)  12/27/22 241 lb (109.3 kg)  04/12/22 232 lb 6.4 oz (105.4 kg)   Physical Exam Vitals and nursing note reviewed.  Constitutional:      General: She is not in acute distress.    Appearance: She is well-developed.  HENT:     Head: Normocephalic and atraumatic.     Right Ear: Hearing, tympanic membrane, ear canal and external ear normal.     Left Ear: Hearing, tympanic membrane, ear canal and external ear normal.     Mouth/Throat:     Pharynx: Uvula midline.  Eyes:     Conjunctiva/sclera: Conjunctivae normal.     Pupils: Pupils are equal, round, and reactive to light.  Neck:     Thyroid: Thyromegaly (Mild.) present. No thyroid mass.     Trachea: No tracheal deviation.  Cardiovascular:     Rate and Rhythm: Normal rate and regular rhythm.     Pulses:          Dorsalis pedis pulses are 2+ on the right side and 2+ on the left side.     Heart sounds: No murmur heard. Pulmonary:     Effort: Pulmonary effort is normal. No respiratory distress.     Breath sounds: Normal breath sounds.  Abdominal:     Palpations: Abdomen is soft. There is no hepatomegaly or mass.     Tenderness: There is no abdominal tenderness.  Genitourinary:    Comments: Deferred to gyn. Musculoskeletal:     Comments: No signs of synovitis appreciated.  Lymphadenopathy:     Cervical: No cervical adenopathy.     Upper Body:     Right upper body: No supraclavicular adenopathy.     Left upper body: No supraclavicular adenopathy.  Skin:    General: Skin is warm.     Findings: No erythema or rash.  Neurological:     Mental Status: She is alert and oriented to person, place, and time.     Cranial Nerves:  No cranial nerve deficit.     Coordination: Coordination normal.     Gait: Gait normal.     Deep Tendon Reflexes:     Reflex Scores:      Bicep reflexes are 2+ on the right side and 2+ on the left side.      Patellar reflexes are 2+ on the right side and 2+ on the left side. Psychiatric:        Speech: Speech normal.     Comments: Well groomed, good eye contact.   ASSESSMENT AND PLAN:  Ms. ALMARIE GLAZER was here today annual physical examination.  Orders Placed This Encounter  Procedures   Comprehensive metabolic panel   CBC   VITAMIN D 25 Hydroxy (Vit-D Deficiency, Fractures)   TSH   Lipid panel   Iron   Ferritin   Lab Results  Component Value Date   CHOL 263 (H) 02/13/2023   HDL 47.20 02/13/2023   LDLCALC 192 (H) 02/13/2023   TRIG 116.0 02/13/2023   CHOLHDL 6 02/13/2023   Lab Results  Component Value Date   TSH 2.03 02/13/2023   Lab Results  Component Value Date   CREATININE 0.86 02/13/2023   BUN 14 02/13/2023   NA 137 02/13/2023   K 3.6 02/13/2023   CL 100 02/13/2023   CO2 29 02/13/2023   Lab Results  Component Value Date   ALT 23 02/13/2023   AST 22 02/13/2023   ALKPHOS 140 (H) 02/13/2023   BILITOT 0.6 02/13/2023   Lab Results  Component Value Date   WBC 12.4 (H) 02/13/2023   HGB 11.5 (L) 02/13/2023   HCT 35.9 (L) 02/13/2023   MCV 84.1 02/13/2023   PLT 521.0 (H) 02/13/2023   Routine general medical examination at a health care facility Assessment &  Plan: We discussed the importance of regular physical activity and healthy diet for prevention of chronic illness and/or complications. Preventive guidelines reviewed. Vaccination: Declined shingrix. Ca++ and vit D supplementation recommended. Continue her female preventive care with her gyn. Next CPE in a year.   Hyperlipidemia, unspecified hyperlipidemia type Assessment & Plan: She has not been taking rosuvastatin for the past 4 weeks, medication was causing her lower extremity aches. Non  pharmacologic treatment recommended for now. Further recommendations will be given according to 10 years CVD risk score and lipid panel numbers. We could consider resuming rosuvastatin 3 times per week.  Orders: -     Comprehensive metabolic panel; Future -     Lipid panel; Future  Hypertension, essential Assessment & Plan: BP adequately controlled. Continue Losartan-HCTZ same dose. DASH/low salt diet to continue. Monitor BP at home. Eye exam is current.  Orders: -     Comprehensive metabolic panel; Future -     TSH; Future -     Losartan Potassium-HCTZ; Take 1 tablet by mouth daily.  Dispense: 90 tablet; Refill: 3  Vitamin D deficiency, unspecified Assessment & Plan: Not on vit D supplementation at this time. 25 OH vit D ordered today and further recommendations will be given accordingly.  Orders: -     VITAMIN D 25 Hydroxy (Vit-D Deficiency, Fractures); Future  Iron deficiency anemia, unspecified iron deficiency anemia type Assessment & Plan: She is not taking Fe Sulfate and not longer following with hematologist. Further recommendations according to lab results.  Orders: -     CBC; Future -     Iron -     Ferritin  Ulcerative colitis without complications, unspecified location [facility]) Assessment & Plan: Well controlled. Follows with GI.  Orders: -     CBC; Future  Essential (hemorrhagic) thrombocythemia ([place]) Assessment & Plan: Problem has been stable. Last visit with hematologist 10/17/21.   Class 2 severe obesity due to excess calories with serious comorbidity and body mass index (BMI) of 37.0 to 37.9 in adult [facility]) Assessment & Plan: Has gained about 10-12 Lb since 01/2022. We discussed benefits of wt loss as well as adverse effects of obesity. Consistency with healthy diet and physical activity recommended. Daily brisk walking for 15-30 min as tolerated.    Return in 1 year (on 02/13/2024) for chronic problems, CPE.  Wade Sigala G. [place], MD  [facility]. Many office.

## 2023-02-13 ENCOUNTER — Encounter: Payer: Self-pay | Admitting: Family Medicine

## 2023-02-13 ENCOUNTER — Ambulatory Visit (INDEPENDENT_AMBULATORY_CARE_PROVIDER_SITE_OTHER): Payer: BC Managed Care – PPO | Admitting: Family Medicine

## 2023-02-13 VITALS — BP 120/80 | HR 100 | Temp 98.2°F | Resp 12 | Ht 67.5 in | Wt 244.2 lb

## 2023-02-13 DIAGNOSIS — D509 Iron deficiency anemia, unspecified: Secondary | ICD-10-CM

## 2023-02-13 DIAGNOSIS — E559 Vitamin D deficiency, unspecified: Secondary | ICD-10-CM | POA: Diagnosis not present

## 2023-02-13 DIAGNOSIS — I10 Essential (primary) hypertension: Secondary | ICD-10-CM

## 2023-02-13 DIAGNOSIS — Z0000 Encounter for general adult medical examination without abnormal findings: Secondary | ICD-10-CM | POA: Diagnosis not present

## 2023-02-13 DIAGNOSIS — E785 Hyperlipidemia, unspecified: Secondary | ICD-10-CM | POA: Diagnosis not present

## 2023-02-13 DIAGNOSIS — D473 Essential (hemorrhagic) thrombocythemia: Secondary | ICD-10-CM

## 2023-02-13 DIAGNOSIS — Z6837 Body mass index (BMI) 37.0-37.9, adult: Secondary | ICD-10-CM

## 2023-02-13 DIAGNOSIS — K5190 Ulcerative colitis, unspecified, without complications: Secondary | ICD-10-CM | POA: Diagnosis not present

## 2023-02-13 LAB — COMPREHENSIVE METABOLIC PANEL
ALT: 23 U/L (ref 0–35)
AST: 22 U/L (ref 0–37)
Albumin: 3.9 g/dL (ref 3.5–5.2)
Alkaline Phosphatase: 140 U/L — ABNORMAL HIGH (ref 39–117)
BUN: 14 mg/dL (ref 6–23)
CO2: 29 mEq/L (ref 19–32)
Calcium: 9.5 mg/dL (ref 8.4–10.5)
Chloride: 100 mEq/L (ref 96–112)
Creatinine, Ser: 0.86 mg/dL (ref 0.40–1.20)
GFR: 73.65 mL/min (ref >60.00)
Glucose, Bld: 101 mg/dL — ABNORMAL HIGH (ref 70–99)
Potassium: 3.6 mEq/L (ref 3.5–5.1)
Sodium: 137 mEq/L (ref 135–145)
Total Bilirubin: 0.6 mg/dL (ref 0.2–1.2)
Total Protein: 7.5 g/dL (ref 6.0–8.3)

## 2023-02-13 LAB — LIPID PANEL
Cholesterol: 263 mg/dL — ABNORMAL HIGH (ref 0–200)
HDL: 47.2 mg/dL (ref >39.00)
LDL Cholesterol: 192 mg/dL — ABNORMAL HIGH (ref 0–99)
NonHDL: 215.52
Total CHOL/HDL Ratio: 6
Triglycerides: 116 mg/dL (ref 0.0–149.0)
VLDL: 23.2 mg/dL (ref 0.0–40.0)

## 2023-02-13 LAB — CBC
HCT: 35.9 % — ABNORMAL LOW (ref 36.0–46.0)
Hemoglobin: 11.5 g/dL — ABNORMAL LOW (ref 12.0–15.0)
MCHC: 32.1 g/dL (ref 30.0–36.0)
MCV: 84.1 fl (ref 78.0–100.0)
Platelets: 521 10*3/uL — ABNORMAL HIGH (ref 150.0–400.0)
RBC: 4.27 Mil/uL (ref 3.87–5.11)
RDW: 15.8 % — ABNORMAL HIGH (ref 11.5–15.5)
WBC: 12.4 10*3/uL — ABNORMAL HIGH (ref 4.0–10.5)

## 2023-02-13 LAB — FERRITIN: Ferritin: 191.1 ng/mL (ref 10.0–291.0)

## 2023-02-13 LAB — VITAMIN D 25 HYDROXY (VIT D DEFICIENCY, FRACTURES): VITD: 24.39 ng/mL — ABNORMAL LOW (ref 30.00–100.00)

## 2023-02-13 LAB — TSH: TSH: 2.03 u[IU]/mL (ref 0.35–5.50)

## 2023-02-13 LAB — IRON: Iron: 58 ug/dL (ref 42–145)

## 2023-02-13 MED ORDER — LOSARTAN POTASSIUM-HCTZ 100-25 MG PO TABS: 1.0000 | ORAL_TABLET | Freq: Every day | ORAL | 3 refills | Status: DC

## 2023-02-13 NOTE — Assessment & Plan Note (Signed)
Well controlled. Follows with GI.

## 2023-02-13 NOTE — Assessment & Plan Note (Addendum)
She has not been taking rosuvastatin for the past 4 weeks, medication was causing her lower extremity aches. Non pharmacologic treatment recommended for now. Further recommendations will be given according to 10 years CVD risk score and lipid panel numbers. We could consider resuming rosuvastatin 3 times per week.

## 2023-02-13 NOTE — Assessment & Plan Note (Signed)
We discussed the importance of regular physical activity and healthy diet for prevention of chronic illness and/or complications. Preventive guidelines reviewed. Vaccination: Declined shingrix. Ca++ and vit D supplementation recommended. Continue her female preventive care with her gyn. Next CPE in a year.

## 2023-02-13 NOTE — Assessment & Plan Note (Signed)
BP adequately controlled. Continue Losartan-HCTZ same dose. DASH/low salt diet to continue. Monitor BP at home. Eye exam is current.

## 2023-02-13 NOTE — Patient Instructions (Addendum)
A few things to remember from today's visit:  Routine general medical examination at a health care facility  Hyperlipidemia, unspecified hyperlipidemia type - Plan: Comprehensive metabolic panel, Lipid panel  Hypertension, essential - Plan: Comprehensive metabolic panel, TSH  Vitamin D deficiency, unspecified - Plan: VITAMIN D 25 Hydroxy (Vit-D Deficiency, Fractures)  Iron deficiency anemia, unspecified iron deficiency anemia type - Plan: CBC, Iron, Ferritin  Ulcerative colitis without complications, unspecified location [facility]) If we are going to follow annually you need to monitor blood pressure regularly.  If you need refills for medications you take chronically, please call your pharmacy. Do not use My Chart to request refills or for acute issues that need immediate attention. If you send a my chart message, it may take a few days to be addressed, specially if I am not in the office.  Please be sure medication list is accurate. If a new problem present, please set up appointment sooner than planned today.  Health Maintenance, Female Adopting a healthy lifestyle and getting preventive care are important in promoting health and wellness. Ask your health care provider about: The right schedule for you to have regular tests and exams. Things you can do on your own to prevent diseases and keep yourself healthy. What should I know about diet, weight, and exercise? Eat a healthy diet  Eat a diet that includes plenty of vegetables, fruits, low-fat dairy products, and lean protein. Do not eat a lot of foods that are high in solid fats, added sugars, or sodium. Maintain a healthy weight Body mass index (BMI) is used to identify weight problems. It estimates body fat based on height and weight. Your health care provider can help determine your BMI and help you achieve or maintain a healthy weight. Get regular exercise Get regular exercise. This is one of the most important things you can do  for your health. Most adults should: Exercise for at least 150 minutes each week. The exercise should increase your heart rate and make you sweat (moderate-intensity exercise). Do strengthening exercises at least twice a week. This is in addition to the moderate-intensity exercise. Spend less time sitting. Even light physical activity can be beneficial. Watch cholesterol and blood lipids Have your blood tested for lipids and cholesterol at 60 years of age, then have this test every 5 years. Have your cholesterol levels checked more often if: Your lipid or cholesterol levels are high. You are older than 60 years of age. You are at high risk for heart disease. What should I know about cancer screening? Depending on your health history and family history, you may need to have cancer screening at various ages. This may include screening for: Breast cancer. Cervical cancer. Colorectal cancer. Skin cancer. Lung cancer. What should I know about heart disease, diabetes, and high blood pressure? Blood pressure and heart disease High blood pressure causes heart disease and increases the risk of stroke. This is more likely to develop in people who have high blood pressure readings or are overweight. Have your blood pressure checked: Every 3-5 years if you are 64-73 years of age. Every year if you are 67 years old or older. Diabetes Have regular diabetes screenings. This checks your fasting blood sugar level. Have the screening done: Once every three years after age 62 if you are at a normal weight and have a low risk for diabetes. More often and at a younger age if you are overweight or have a high risk for diabetes. What should I know about  preventing infection? Hepatitis B If you have a higher risk for hepatitis B, you should be screened for this virus. Talk with your health care provider to find out if you are at risk for hepatitis B infection. Hepatitis C Testing is recommended for: Everyone  born from [birth year] through 1963. Anyone with known risk factors for hepatitis C. Sexually transmitted infections (STIs) Get screened for STIs, including gonorrhea and chlamydia, if: You are sexually active and are younger than 60 years of age. You are older than 60 years of age and your health care provider tells you that you are at risk for this type of infection. Your sexual activity has changed since you were last screened, and you are at increased risk for chlamydia or gonorrhea. Ask your health care provider if you are at risk. Ask your health care provider about whether you are at high risk for HIV. Your health care provider may recommend a prescription medicine to help prevent HIV infection. If you choose to take medicine to prevent HIV, you should first get tested for HIV. You should then be tested every 3 months for as long as you are taking the medicine. Pregnancy If you are about to stop having your period (premenopausal) and you may become pregnant, seek counseling before you get pregnant. Take 400 to 800 micrograms (mcg) of folic acid every day if you become pregnant. Ask for birth control (contraception) if you want to prevent pregnancy. Osteoporosis and menopause Osteoporosis is a disease in which the bones lose minerals and strength with aging. This can result in bone fractures. If you are 20 years old or older, or if you are at risk for osteoporosis and fractures, ask your health care provider if you should: Be screened for bone loss. Take a calcium or vitamin D supplement to lower your risk of fractures. Be given hormone replacement therapy (HRT) to treat symptoms of menopause. Follow these instructions at home: Alcohol use Do not drink alcohol if: Your health care provider tells you not to drink. You are pregnant, may be pregnant, or are planning to become pregnant. If you drink alcohol: Limit how much you have to: 0-1 drink a day. Know how much alcohol is in your drink. In  the [place] one drink equals one 12 oz bottle of beer (355 mL), one 5 oz glass of wine (148 mL), or one 1 oz glass of hard liquor (44 mL). Lifestyle Do not use any products that contain nicotine or tobacco. These products include cigarettes, chewing tobacco, and vaping devices, such as e-cigarettes. If you need help quitting, ask your health care provider. Do not use street drugs. Do not share needles. Ask your health care provider for help if you need support or information about quitting drugs. General instructions Schedule regular health, dental, and eye exams. Stay current with your vaccines. Tell your health care provider if: You often feel depressed. You have ever been abused or do not feel safe at home. Summary Adopting a healthy lifestyle and getting preventive care are important in promoting health and wellness. Follow your health care provider's instructions about healthy diet, exercising, and getting tested or screened for diseases. Follow your health care provider's instructions on monitoring your cholesterol and blood pressure. This information is not intended to replace advice given to you by your health care provider. Make sure you discuss any questions you have with your health care provider. Document Revised: 04/10/2021 Document Reviewed: 04/10/2021 Elsevier Patient Education  [place].

## 2023-02-17 DIAGNOSIS — D473 Essential (hemorrhagic) thrombocythemia: Secondary | ICD-10-CM | POA: Insufficient documentation

## 2023-02-17 NOTE — Assessment & Plan Note (Signed)
Has gained about 10-12 Lb since 01/2022. We discussed benefits of wt loss as well as adverse effects of obesity. Consistency with healthy diet and physical activity recommended. Daily brisk walking for 15-30 min as tolerated.

## 2023-02-17 NOTE — Assessment & Plan Note (Signed)
She is not taking Fe Sulfate and not longer following with hematologist. Further recommendations according to lab results.

## 2023-02-17 NOTE — Assessment & Plan Note (Signed)
Problem has been stable. Last visit with hematologist 10/17/21.

## 2023-02-17 NOTE — Assessment & Plan Note (Signed)
Not on vit D supplementation at this time. 25 OH vit D ordered today and further recommendations will be given accordingly.

## 2023-02-18 ENCOUNTER — Encounter: Payer: Self-pay | Admitting: Family Medicine

## 2023-02-25 NOTE — Telephone Encounter (Signed)
Pt calling to check on progress of this request.  

## 2023-02-26 ENCOUNTER — Other Ambulatory Visit: Payer: Self-pay | Admitting: Family Medicine

## 2023-02-26 DIAGNOSIS — E785 Hyperlipidemia, unspecified: Secondary | ICD-10-CM

## 2023-02-26 MED ORDER — PITAVASTATIN CALCIUM 2 MG PO TABS: 2.0000 mg | ORAL_TABLET | Freq: Every day | ORAL | 0 refills | Status: DC

## 2023-02-27 MED ORDER — PITAVASTATIN CALCIUM 2 MG PO TABS: 2.0000 mg | ORAL_TABLET | Freq: Every day | ORAL | 0 refills | Status: DC

## 2023-02-27 NOTE — Addendum Note (Signed)
Addended by: Rodrigo Ran on: 02/27/2023 06:44 AM   Modules accepted: Orders

## 2023-03-01 ENCOUNTER — Ambulatory Visit (HOSPITAL_COMMUNITY): Payer: BC Managed Care – PPO | Attending: Cardiology

## 2023-03-01 DIAGNOSIS — R000 Tachycardia, unspecified: Secondary | ICD-10-CM | POA: Diagnosis not present

## 2023-03-01 DIAGNOSIS — R011 Cardiac murmur, unspecified: Secondary | ICD-10-CM | POA: Insufficient documentation

## 2023-03-01 DIAGNOSIS — I10 Essential (primary) hypertension: Secondary | ICD-10-CM | POA: Diagnosis not present

## 2023-03-01 LAB — ECHOCARDIOGRAM COMPLETE
Area-P 1/2: 4.4 cm2
S' Lateral: 2.5 cm

## 2023-03-19 ENCOUNTER — Telehealth: Payer: Self-pay | Admitting: Family Medicine

## 2023-03-19 NOTE — Telephone Encounter (Signed)
Medication isn't covered by insurance & PA was denied. Are there any options? She's tried multiple statins.  We went over her glucose level in her lab work & she verbalized understanding.

## 2023-03-19 NOTE — Telephone Encounter (Signed)
Pt called to say she is being charge $700 for the Pitavastatin Calcium 2 MG TABS   Pt states she used to have BC/BS, but now has [place].  Pt is requesting a more affordable alternative.  Also, Pt has questions regarding her Glucose levels.   [id number] [place], [place] - [address] Phone: [phone]  Fax: [phone]

## 2023-03-25 NOTE — Telephone Encounter (Signed)
Zetia 10 mg daily is another option. Thanks, BJ

## 2023-03-26 MED ORDER — EZETIMIBE 10 MG PO TABS: 10.0000 mg | ORAL_TABLET | Freq: Every day | ORAL | 3 refills | Status: DC

## 2023-03-26 NOTE — Telephone Encounter (Signed)
Patient is aware Rx has been sent in. 

## 2023-03-26 NOTE — Addendum Note (Signed)
Addended by: Kathreen Devoid on: 03/26/2023 09:17 AM   Modules accepted: Orders

## 2023-05-11 ENCOUNTER — Other Ambulatory Visit: Payer: Self-pay | Admitting: Family Medicine

## 2023-06-24 ENCOUNTER — Other Ambulatory Visit: Payer: Self-pay | Admitting: Family Medicine

## 2023-07-25 ENCOUNTER — Other Ambulatory Visit: Payer: Self-pay | Admitting: Family Medicine

## 2023-07-25 DIAGNOSIS — E559 Vitamin D deficiency, unspecified: Secondary | ICD-10-CM

## 2023-08-29 ENCOUNTER — Other Ambulatory Visit: Payer: Self-pay | Admitting: Family Medicine

## 2023-08-29 DIAGNOSIS — Z1231 Encounter for screening mammogram for malignant neoplasm of breast: Secondary | ICD-10-CM

## 2023-09-24 ENCOUNTER — Ambulatory Visit: Payer: BC Managed Care – PPO | Admitting: Family Medicine

## 2023-10-11 ENCOUNTER — Ambulatory Visit
Admission: RE | Admit: 2023-10-11 | Discharge: 2023-10-11 | Disposition: A | Discharge status: Home / Self Care | Payer: Managed Care, Other (non HMO) | Source: Ambulatory Visit | Attending: Family Medicine | Admitting: Family Medicine

## 2023-10-11 ENCOUNTER — Ambulatory Visit: Payer: Managed Care, Other (non HMO)

## 2023-10-11 DIAGNOSIS — Z1231 Encounter for screening mammogram for malignant neoplasm of breast: Secondary | ICD-10-CM

## 2023-11-13 ENCOUNTER — Encounter: Payer: Self-pay | Admitting: Obstetrics and Gynecology

## 2023-11-13 ENCOUNTER — Ambulatory Visit: Payer: Managed Care, Other (non HMO) | Admitting: Obstetrics and Gynecology

## 2023-11-13 VITALS — BP 126/80 | HR 95 | Ht 69.0 in | Wt 238.0 lb

## 2023-11-13 DIAGNOSIS — N939 Abnormal uterine and vaginal bleeding, unspecified: Secondary | ICD-10-CM

## 2023-11-13 NOTE — Progress Notes (Signed)
GYNECOLOGY  VISIT   HPI: 62 y.o.   Married  [place] female   [phone] with No LMP recorded (lmp unknown). Patient is postmenopausal.   here for: started menstrual cycle on Monday. Felt cramping on Monday and Tuesday.  Changing pad 1 - 2 per day.   Mirena IUD removed in December, 2023.  [facility] 11/08/21 44.   GYNECOLOGIC HISTORY: No LMP recorded (lmp unknown). Patient is postmenopausal. Contraception:PMP Menopausal hormone therapy:  n/a Last 2 paps:  12/27/22 neg, 11/08/21 ASCUS: HR HPV neg History of abnormal Pap or positive HPV:  yes Mammogram:  10/11/23 Breast density Cat B, BI-RADS CAT 1 neg        OB History     Gravida  5   Para  1   Term  1   Preterm      AB  4   Living  1      SAB      IAB      Ectopic  4   Multiple      Live Births                 Patient Active Problem List   Diagnosis Date Noted   Essential (hemorrhagic) thrombocythemia (HCC) 02/17/2023   Routine general medical examination at a health care facility 02/13/2023   Vitamin D deficiency, unspecified 03/22/2021   Ulcerative colitis (HCC) 10/06/2020   Normocytic anemia 03/03/2018   Iron deficiency anemia 07/15/2017   Hyperlipidemia 01/17/2017   Class 2 obesity with body mass index (BMI) of 37.0 to 37.9 in adult 01/17/2017   Hypertension, essential 01/17/2017   Abnormal finding on MRI of brain 01/07/2017    Past Medical History:  Diagnosis Date   Arthritis    knees   Chest tightness    Heart murmur    per pt had echo done yrs ago was told mild   History of hyperprolactinemia    02/ 2018  resolved   Hyperlipidemia    Hypertension    Iron deficiency anemia    Low back pain    Menometrorrhagia    REFRACTORY TO PROGESTIN   Sinus tachycardia    Ulcerative colitis (HCC) followed by [facility]   per pt dx 1990s   Wears glasses     Past Surgical History:  Procedure Laterality Date   CESAREAN SECTION  1988   ECTOPIC PREGNANCY SURGERY  2002 approx.   right partial  salpingectomy via lapartomy   EXPLORATORY LAPAROTOMY W/ LEFT SALPINGECTOMY FOR ECTOPIC  03-31-2003   dr Antionette Char   INTRAUTERINE DEVICE (IUD) INSERTION     mirena inserted 08-13-17    Current Outpatient Medications  Medication Sig Dispense Refill   losartan-hydrochlorothiazide (HYZAAR) 100-25 MG tablet Take 1 tablet by mouth daily. 90 tablet 3   mesalamine (APRISO) 0.375 g 24 hr capsule Take 1,500 mg by mouth every morning.      No current facility-administered medications for this visit.     ALLERGIES: Patient has no known allergies.  Family History  Problem Relation Age of Onset   Angina Mother    Thyroid disease Mother    Arthritis Mother    Heart disease Mother    Hypertension Mother    Hyperlipidemia Mother    Prostate cancer Father    Arthritis Father    Cancer Father        prostate   Hypertension Father    Cancer Maternal Aunt        breast  Breast cancer Maternal Aunt    Breast cancer Cousin     Social History   Socioeconomic History   Marital status: Married    Spouse name: Ethelene Browns   Number of children: 1   Years of education: BA   Highest education level: Not on file  Occupational History   Occupation: Community education officer  Tobacco Use   Smoking status: Former    Current packs/day: 0.00    Types: Cigarettes    Start date: 12/04/1987    Quit date: 12/03/1997    Years since quitting: 25.9   Smokeless tobacco: Never  Vaping Use   Vaping status: Never Used  Substance and Sexual Activity   Alcohol use: No   Drug use: No   Sexual activity: Yes    Partners: Male    Birth control/protection: I.U.D.    Comment: First IC @ 68 y/o, >5 Partners, Mirena IUD placed 08/13/2017  Other Topics Concern   Not on file  Social History Narrative   Lives with husband   Caffeine use: coffee daily   Pepsi on weekends   Social Determinants of Health   Financial Resource Strain: Not on file  Food Insecurity: Not on file  Transportation Needs: Not on file  Physical  Activity: Not on file  Stress: Not on file  Social Connections: Not on file  Intimate Partner Violence: Not on file    Review of Systems  Genitourinary:  Positive for vaginal bleeding.    PHYSICAL EXAMINATION:   BP 126/80 (BP Location: Right Arm, Patient Position: Sitting, Cuff Size: Large)   Pulse 95   Ht 5\' 9"  (1.753 m)   Wt 238 lb (108 kg)   LMP  (LMP Unknown)   SpO2 96%   BMI 35.15 kg/m     General appearance: alert, cooperative and appears stated age   Pelvic: External genitalia:  no lesions              Urethra:  normal appearing urethra with no masses, tenderness or lesions              Bartholins and Skenes: normal                 Vagina: normal appearing vagina with normal color and discharge, no lesions              Cervix: no lesions.  Blood coming from the os.                 Bimanual Exam:  Uterus:  normal size, contour, position, consistency, mobility, non-tender              Adnexa: no mass, fullness, tenderness              Rectal exam: Yes.  .  Confirms.              Anus:  normal sphincter tone, no lesions  Chaperone was present for exam:  Warren Lacy, CMA  ASSESSMENT:  Vaginal bleeding.  Status post removal of Mirena IUD 11/2022.  Postmenopausal by [facility] done in 2022.   PLAN:  Will check FSH and estradiol today.  We dicussed postmenopausal bleeding and etiologies of polyps, hyperplasia and possible malignancy.  If labs confirm menopause, will get pelvic ultrasound and do endometrial biopsy.

## 2023-11-14 ENCOUNTER — Other Ambulatory Visit: Payer: Self-pay | Admitting: Obstetrics and Gynecology

## 2023-11-14 DIAGNOSIS — N950 Postmenopausal bleeding: Secondary | ICD-10-CM

## 2023-11-14 LAB — FOLLICLE STIMULATING HORMONE: FSH: 45.6 m[IU]/mL

## 2023-11-14 LAB — ESTRADIOL: Estradiol: <15 pg/mL

## 2023-12-10 NOTE — Progress Notes (Deleted)
 GYNECOLOGY  VISIT   HPI: 63 y.o.   Married  [place] female   [phone] with No LMP recorded (lmp unknown). Patient is postmenopausal.   here for: PUS and endo bx     GYNECOLOGIC HISTORY: No LMP recorded (lmp unknown). Patient is postmenopausal. Contraception:  PMP Menopausal hormone therapy:  n/a Last 2 paps:  12/27/22 neg, 11/08/21 ASCUS: HR HPV neg History of abnormal Pap or positive HPV:  yes Mammogram:  10/11/23 Breast Density Cat B, BI-RADS CAT 1 neg        OB History     Gravida  5   Para  1   Term  1   Preterm      AB  4   Living  1      SAB      IAB      Ectopic  4   Multiple      Live Births                 Patient Active Problem List   Diagnosis Date Noted   Essential (hemorrhagic) thrombocythemia (HCC) 02/17/2023   Routine general medical examination at a health care facility 02/13/2023   Vitamin D deficiency, unspecified 03/22/2021   Ulcerative colitis (HCC) 10/06/2020   Normocytic anemia 03/03/2018   Iron deficiency anemia 07/15/2017   Hyperlipidemia 01/17/2017   Class 2 obesity with body mass index (BMI) of 37.0 to 37.9 in adult 01/17/2017   Hypertension, essential 01/17/2017   Abnormal finding on MRI of brain 01/07/2017    Past Medical History:  Diagnosis Date   Arthritis    knees   Chest tightness    Heart murmur    per pt had echo done yrs ago was told mild   History of hyperprolactinemia    02/ 2018  resolved   Hyperlipidemia    Hypertension    Iron deficiency anemia    Low back pain    Menometrorrhagia    REFRACTORY TO PROGESTIN   Sinus tachycardia    Ulcerative colitis (HCC) followed by [facility]   per pt dx 1990s   Wears glasses     Past Surgical History:  Procedure Laterality Date   CESAREAN SECTION  1988   ECTOPIC PREGNANCY SURGERY  2002 approx.   right partial salpingectomy via lapartomy   EXPLORATORY LAPAROTOMY W/ LEFT SALPINGECTOMY FOR ECTOPIC  03-31-2003   dr Antionette Char   INTRAUTERINE  DEVICE (IUD) INSERTION     mirena inserted 08-13-17    Current Outpatient Medications  Medication Sig Dispense Refill   losartan-hydrochlorothiazide (HYZAAR) 100-25 MG tablet Take 1 tablet by mouth daily. 90 tablet 3   mesalamine (APRISO) 0.375 g 24 hr capsule Take 1,500 mg by mouth every morning.      No current facility-administered medications for this visit.     ALLERGIES: Patient has no known allergies.  Family History  Problem Relation Age of Onset   Angina Mother    Thyroid disease Mother    Arthritis Mother    Heart disease Mother    Hypertension Mother    Hyperlipidemia Mother    Prostate cancer Father    Arthritis Father    Cancer Father        prostate   Hypertension Father    Cancer Maternal Aunt        breast   Breast cancer Maternal Aunt    Breast cancer Cousin     Social History   Socioeconomic History  Marital status: Married    Spouse name: Ethelene Browns   Number of children: 1   Years of education: BA   Highest education level: Not on file  Occupational History   Occupation: [employer]  Tobacco Use   Smoking status: Former    Current packs/day: 0.00    Types: Cigarettes    Start date: 12/04/1987    Quit date: 12/03/1997    Years since quitting: 26.0   Smokeless tobacco: Never  Vaping Use   Vaping status: Never Used  Substance and Sexual Activity   Alcohol use: No   Drug use: No   Sexual activity: Yes    Partners: Male    Birth control/protection: I.U.D.    Comment: First IC @ 51 y/o, >5 Partners, Mirena IUD placed 08/13/2017  Other Topics Concern   Not on file  Social History Narrative   Lives with husband   Caffeine use: coffee daily   Pepsi on weekends   Social Drivers of Corporate investment banker Strain: Not on file  Food Insecurity: Not on file  Transportation Needs: Not on file  Physical Activity: Not on file  Stress: Not on file  Social Connections: Not on file  Intimate Partner Violence: Not on file    Review of  Systems  PHYSICAL EXAMINATION:   LMP  (LMP Unknown)     General appearance: alert, cooperative and appears stated age Head: Normocephalic, without obvious abnormality, atraumatic Neck: no adenopathy, supple, symmetrical, trachea midline and thyroid normal to inspection and palpation Lungs: clear to auscultation bilaterally Breasts: normal appearance, no masses or tenderness, No nipple retraction or dimpling, No nipple discharge or bleeding, No axillary or supraclavicular adenopathy Heart: regular rate and rhythm Abdomen: soft, non-tender, no masses,  no organomegaly Extremities: extremities normal, atraumatic, no cyanosis or edema Skin: Skin color, texture, turgor normal. No rashes or lesions Lymph nodes: Cervical, supraclavicular, and axillary nodes normal. No abnormal inguinal nodes palpated Neurologic: Grossly normal  Pelvic: External genitalia:  no lesions              Urethra:  normal appearing urethra with no masses, tenderness or lesions              Bartholins and Skenes: normal                 Vagina: normal appearing vagina with normal color and discharge, no lesions              Cervix: no lesions                Bimanual Exam:  Uterus:  normal size, contour, position, consistency, mobility, non-tender              Adnexa: no mass, fullness, tenderness              Rectal exam: {yes no:314532}.  Confirms.              Anus:  normal sphincter tone, no lesions  Chaperone was present for exam:  {BSCHAPERONE:31226::"Mariacristina Aday F, CMA"}  ASSESSMENT:    PLAN:    {LABS (Optional):23779}  ***  total time was spent for this patient encounter, including preparation, face-to-face counseling with the patient, coordination of care, and documentation of the encounter.

## 2023-12-24 ENCOUNTER — Other Ambulatory Visit: Payer: Managed Care, Other (non HMO) | Admitting: Obstetrics and Gynecology

## 2023-12-24 ENCOUNTER — Other Ambulatory Visit: Payer: Managed Care, Other (non HMO)

## 2024-01-28 ENCOUNTER — Encounter: Payer: Self-pay | Admitting: Obstetrics and Gynecology

## 2024-01-28 NOTE — Progress Notes (Signed)
 Surg   GYNECOLOGY  VISIT   HPI: 63 y.o.   Married  [place] female   [phone] with No LMP recorded (lmp unknown). Patient is postmenopausal.   here for: endometrial  biopsy and pelvic US.   Had some vaginal bleeding/spotting again middle of January.  No pain of discomfort.      FSH 45.6 and E2 < 15 on 11/13/23.   Her IUD was removed in Dec. 2023.   GYNECOLOGIC HISTORY: No LMP recorded (lmp unknown). Patient is postmenopausal. Contraception:  PMP Menopausal hormone therapy:  n/a Last 2 paps:   12/27/22 neg, 11/08/21 ASCUS: HR HPV neg  History of abnormal Pap or positive HPV:  yes Mammogram:  10/11/23 Breast density Cat B, BI-RADS CAT 1 neg         OB History     Gravida  5   Para  1   Term  1   Preterm      AB  4   Living  1      SAB      IAB      Ectopic  4   Multiple      Live Births                 Patient Active Problem List   Diagnosis Date Noted   Essential (hemorrhagic) thrombocythemia (HCC) 02/17/2023   Routine general medical examination at a health care facility 02/13/2023   Vitamin D deficiency, unspecified 03/22/2021   Ulcerative colitis (HCC) 10/06/2020   Normocytic anemia 03/03/2018   Iron deficiency anemia 07/15/2017   Hyperlipidemia 01/17/2017   Class 2 obesity with body mass index (BMI) of 37.0 to 37.9 in adult 01/17/2017   Hypertension, essential 01/17/2017   Abnormal finding on MRI of brain 01/07/2017    Past Medical History:  Diagnosis Date   Arthritis    knees   Chest tightness    Heart murmur    per pt had echo done yrs ago was told mild   History of hyperprolactinemia    02/ 2018  resolved   Hyperlipidemia    Hypertension    Iron deficiency anemia    Low back pain    Menometrorrhagia    REFRACTORY TO PROGESTIN   Sinus tachycardia    Ulcerative colitis (HCC) followed by [facility]   per pt dx 1990s   Wears glasses     Past Surgical History:  Procedure Laterality Date   CESAREAN SECTION  1988   ECTOPIC  PREGNANCY SURGERY  2002 approx.   right partial salpingectomy via lapartomy   EXPLORATORY LAPAROTOMY W/ LEFT SALPINGECTOMY FOR ECTOPIC  03-31-2003   dr Antionette Char   INTRAUTERINE DEVICE (IUD) INSERTION     mirena inserted 08-13-17    Current Outpatient Medications  Medication Sig Dispense Refill   losartan-hydrochlorothiazide (HYZAAR) 100-25 MG tablet Take 1 tablet by mouth daily. 90 tablet 3   mesalamine (APRISO) 0.375 g 24 hr capsule Take 1,500 mg by mouth every morning.      No current facility-administered medications for this visit.     ALLERGIES: Patient has no known allergies.  Family History  Problem Relation Age of Onset   Angina Mother    Thyroid disease Mother    Arthritis Mother    Heart disease Mother    Hypertension Mother    Hyperlipidemia Mother    Prostate cancer Father    Arthritis Father    Cancer Father        prostate  Hypertension Father    Cancer Maternal Aunt        breast   Breast cancer Maternal Aunt    Breast cancer Cousin     Social History   Socioeconomic History   Marital status: Married    Spouse name: Ethelene Browns   Number of children: 1   Years of education: BA   Highest education level: Not on file  Occupational History   Occupation: [employer]  Tobacco Use   Smoking status: Former    Current packs/day: 0.00    Types: Cigarettes    Start date: 12/04/1987    Quit date: 12/03/1997    Years since quitting: 26.2   Smokeless tobacco: Never  Vaping Use   Vaping status: Never Used  Substance and Sexual Activity   Alcohol use: No   Drug use: No   Sexual activity: Yes    Partners: Male    Birth control/protection: Post-menopausal    Comment: First IC @ 65 y/o, >5 Partners  Other Topics Concern   Not on file  Social History Narrative   Lives with husband   Caffeine use: coffee daily   Pepsi on weekends   Social Drivers of Corporate investment banker Strain: Not on file  Food Insecurity: Not on file  Transportation Needs: Not  on file  Physical Activity: Not on file  Stress: Not on file  Social Connections: Not on file  Intimate Partner Violence: Not on file    Review of Systems  All other systems reviewed and are negative.   PHYSICAL EXAMINATION:   BP 124/80 (BP Location: Right Arm, Patient Position: Sitting, Cuff Size: Large)   Pulse 72   LMP  (LMP Unknown)   SpO2 97%     General appearance: alert, cooperative and appears stated age   Pelvic US: Uterus 7.36 x 3.68 x 3.52 cm.  2 intramural fibroids:  1.12 cm and 2.03 cm, which displaces the endometrium.   EMS 2.31 mm.  No masses or thickening.  Left ovary 1.89 x 1.29 x 1.23 cm.  Right ovary not seen.  No adnexal masses.  No free fluid.   EMB: Consent for procedure. Time out done. Sterile prep with Hibiclens.   Paracervical block with 1% lidocaine 10 cc, lot number  [id number], expiration 08/2025 Tenaculum to anterior cervical lip. Pipelle passed to almost 8 cm twice.   Silver nitrate to the cervix at tenaculum site and the lidocaine injection sites.  Tissue to pathology.  Minimal EBL. No complications.   Chaperone was present for exam:  Warren Lacy, CMA  ASSESSMENT:  Recurrent postmenopausal bleeding.  Uterine fibroids. Hx right oophorectomy for ectopic pregnancy per patient.   PLAN:  Pelvic US and images reviewed.  Fibroids discussed.  FU EMB.  Keep appointment for annual exam tomorrow.   20 min  total time was spent for this patient encounter, including preparation, face-to-face counseling with the patient, coordination of care, and documentation of the encounter in addition to doing the endometrial biopsy.

## 2024-01-29 ENCOUNTER — Encounter: Payer: Self-pay | Admitting: Obstetrics and Gynecology

## 2024-01-29 NOTE — Progress Notes (Signed)
 63 y.o. Amy Coffey Married [place] American female here for annual exam.    Patient seen yesterday for pelvic ultrasound and endometrial biopsy due to postmenopausal bleeding.  Has small fibroids.  EMS 2.31 mm. Right ovary absent.  Left ovary normal.  EMB is pending.  Pap could not be done as she had gel used for the pelvic ultrasound.   No bleeding for one month.    Will see her PCP this week.   PCP: [place], Betty G, MD   No LMP recorded (lmp unknown). Patient is postmenopausal.           Sexually active: Yes.    The current method of family planning is post menopausal status.    Menopausal hormone therapy:  n/a Exercising: No.   Smoker:  former  OB History  Gravida Para Term Preterm AB Living  5 1 1  4 1   SAB IAB Ectopic Multiple Live Births    4      # Outcome Date GA Lbr Len/2nd Weight Sex Type Anes PTL Lv  5 Ectopic           4 Ectopic           3 Ectopic           2 Ectopic           1 Term              HEALTH MAINTENANCE: Last 2 paps:  12/27/22 neg, 11/08/21 ASCUS: HR HPV neg History of abnormal Pap or positive HPV:  yes Mammogram:   10/11/23 Breast Density Cat B, BI-RADS CAT 1 neg Colonoscopy:  12/30/20 - due this year.  Hx colitis.  Bone Density:  n/a  Result  n/a   Immunization History  Administered Date(s) Administered   Influenza Inj Mdck Quad Pf 09/03/2019   Influenza, Seasonal, Injecte, Preservative Fre 09/05/2023   Influenza,inj,Quad PF,6+ Mos 09/10/2022   Tdap 09/02/2017      reports that she quit smoking about 26 years ago. Her smoking use included cigarettes. She started smoking about 36 years ago. She has never used smokeless tobacco. She reports that she does not drink alcohol and does not use drugs.  Past Medical History:  Diagnosis Date   Arthritis    knees   Chest tightness    Heart murmur    per pt had echo done yrs ago was told mild   History of hyperprolactinemia    02/ 2018  resolved   Hyperlipidemia    Hypertension    Iron  deficiency anemia    Low back pain    Menometrorrhagia    REFRACTORY TO PROGESTIN   Sinus tachycardia    Ulcerative colitis (HCC) followed by [facility]   per pt dx 1990s   Wears glasses     Past Surgical History:  Procedure Laterality Date   CESAREAN SECTION  1988   ECTOPIC PREGNANCY SURGERY  2002 approx.   right partial salpingectomy via lapartomy   EXPLORATORY LAPAROTOMY W/ LEFT SALPINGECTOMY FOR ECTOPIC  03-31-2003   dr Antionette Char   INTRAUTERINE DEVICE (IUD) INSERTION     mirena inserted 08-13-17    Current Outpatient Medications  Medication Sig Dispense Refill   cholecalciferol (VITAMIN D3) 25 MCG (1000 UNIT) tablet Take 1,000 Units by mouth daily.     losartan-hydrochlorothiazide (HYZAAR) 100-25 MG tablet Take 1 tablet by mouth daily. 90 tablet 3   mesalamine (APRISO) 0.375 g 24 hr capsule Take 1,500 mg by mouth every  morning.      No current facility-administered medications for this visit.    ALLERGIES: Patient has no known allergies.  Family History  Problem Relation Age of Onset   Angina Mother    Thyroid disease Mother    Arthritis Mother    Heart disease Mother    Hypertension Mother    Hyperlipidemia Mother    Prostate cancer Father    Arthritis Father    Cancer Father        prostate   Hypertension Father    Cancer Maternal Aunt        breast   Breast cancer Maternal Aunt    Breast cancer Cousin     Review of Systems  All other systems reviewed and are negative.   PHYSICAL EXAM:  BP 132/84 (BP Location: Left Arm, Patient Position: Sitting, Cuff Size: Large)   Pulse 87   Ht 5' 9.5" (1.765 m)   Wt 233 lb (105.7 kg)   LMP  (LMP Unknown)   SpO2 97%   BMI 33.91 kg/m     General appearance: alert, cooperative and appears stated age Head: normocephalic, without obvious abnormality, atraumatic Neck: no adenopathy, supple, symmetrical, trachea midline and thyroid normal to inspection and palpation Lungs: clear to auscultation  bilaterally Breasts: normal appearance, no masses or tenderness, No nipple retraction or dimpling, No nipple discharge or bleeding, No axillary adenopathy Heart: regular rate and rhythm Abdomen: soft, non-tender; no masses, no organomegaly Extremities: extremities normal, atraumatic, no cyanosis or edema Skin: skin color, texture, turgor normal. No rashes or lesions Lymph nodes: cervical, supraclavicular, and axillary nodes normal. Neurologic: grossly normal  Pelvic: External genitalia:  no lesions              No abnormal inguinal nodes palpated.              Urethra:  normal appearing urethra with no masses, tenderness or lesions              Bartholins and Skenes: normal                 Vagina: normal appearing vagina with normal color and discharge, no lesions              Cervix: consistent with procedure yesterday for endometrial biopsy with local anesthesia.               Pap taken: yes Bimanual Exam:  Uterus:  normal size, contour, position, consistency, mobility, non-tender              Adnexa: no mass, fullness, tenderness              Rectal exam: yes.  Confirms.              Anus:  normal sphincter tone, no lesions  Chaperone was present for exam:  Warren Lacy, CMA  ASSESSMENT: Well woman visit with gynecologic exam. Hx recurrent postmenopausal bleeding.  Fibroids.  Hx ASCUS, neg HR HPV.  Cervical cancer screening today.  Status post right oophorectomy for ectopic pregnancy.  PHQ-9: 0 Hx colitis.    PLAN: Mammogram screening discussed. Self breast awareness reviewed. Pap and HRV collected:  yes.   Guidelines for Calcium, Vitamin D, regular exercise program including cardiovascular and weight bearing exercise. Medication refills:  NA Will contact patient with EMB result also.  Follow up:  1 year and prn.

## 2024-02-11 ENCOUNTER — Encounter: Payer: Self-pay | Admitting: Obstetrics and Gynecology

## 2024-02-11 ENCOUNTER — Other Ambulatory Visit: Payer: Self-pay | Admitting: Obstetrics and Gynecology

## 2024-02-11 ENCOUNTER — Ambulatory Visit (INDEPENDENT_AMBULATORY_CARE_PROVIDER_SITE_OTHER): Payer: Managed Care, Other (non HMO) | Admitting: Obstetrics and Gynecology

## 2024-02-11 ENCOUNTER — Ambulatory Visit (INDEPENDENT_AMBULATORY_CARE_PROVIDER_SITE_OTHER): Payer: Managed Care, Other (non HMO)

## 2024-02-11 ENCOUNTER — Other Ambulatory Visit (HOSPITAL_COMMUNITY)
Admission: RE | Admit: 2024-02-11 | Discharge: 2024-02-11 | Disposition: A | Discharge status: Home / Self Care | Source: Ambulatory Visit | Attending: Obstetrics and Gynecology | Admitting: Obstetrics and Gynecology

## 2024-02-11 VITALS — BP 124/80 | HR 72

## 2024-02-11 DIAGNOSIS — D219 Benign neoplasm of connective and other soft tissue, unspecified: Secondary | ICD-10-CM | POA: Diagnosis not present

## 2024-02-11 DIAGNOSIS — N950 Postmenopausal bleeding: Secondary | ICD-10-CM

## 2024-02-11 NOTE — Patient Instructions (Signed)
 Endometrial Biopsy  An endometrial biopsy is a procedure where a tissue sample is removed from the lining of the uterus. This lining is called the endometrium. The tissue sample is then sent to a lab for testing. You may have this type of biopsy to check for: Cancer. Infection. Growths called polyps. Uterine bleeding that can't be explained. Tell a health care provider about: Any allergies you have. All medicines you're taking including vitamins, herbs, eye drops, creams, and over-the-counter medicines. Any problems you or family members have had with anesthesia. Any bleeding problems you have. Any surgeries you have had. Any medical problems you have. Whether you're pregnant or may be pregnant. What are the risks? Your health care provider will talk with you about risks. These may include: Bleeding. Infection. Allergic reactions to medicines. Damage to the wall of the uterus. This is rare. What happens before the procedure? Keep track of your period. You may need to have this biopsy when you're not having your period. Ask your provider about: Changing or stopping your regular medicines. These include any diabetes medicines or blood thinners you take. Taking medicines such as aspirin and ibuprofen. These medicines can thin your blood. Do not take them unless your provider tells you to. Taking over-the-counter medicines, vitamins, herbs, and supplements. Bring a pad with you. You may need to wear one after the biopsy. Plan to have a responsible adult take you home from the hospital or clinic. You won't be allowed to drive. What happens during the procedure? A tool will be put into your vagina to hold it open. This helps your provider see the cervix. The cervix is the lowest part of the uterus. Your cervix will be cleaned with a solution that kills germs. You will be given anesthesia. This keeps you from feeling pain. It will numb your cervix. A tool called forceps will be used to  hold your cervix steady. A thin tool called a uterine sound will be put through your cervix. It will be used to: Find the length of your uterus. Find where to take the sample from. A soft tube called a catheter will be put into your uterus. The catheter will remove a tissue sample. The tube and tools will be removed. The sample will be sent to a lab for testing. The procedure may vary among providers and hospitals. What happens after the procedure? Your blood pressure, heart rate, breathing rate, and blood oxygen level will be monitored until you leave the hospital or clinic. It's up to you to get the results of your procedure. Ask your provider, or the department that is doing the procedure, when your results will be ready. This information is not intended to replace advice given to you by your health care provider. Make sure you discuss any questions you have with your health care provider. Document Revised: 01/29/2023 Document Reviewed: 01/29/2023 Elsevier Patient Education  2024 [employer].

## 2024-02-12 ENCOUNTER — Other Ambulatory Visit (HOSPITAL_COMMUNITY)
Admission: RE | Admit: 2024-02-12 | Discharge: 2024-02-12 | Disposition: A | Discharge status: Home / Self Care | Source: Ambulatory Visit | Attending: Obstetrics and Gynecology | Admitting: Obstetrics and Gynecology

## 2024-02-12 ENCOUNTER — Ambulatory Visit (INDEPENDENT_AMBULATORY_CARE_PROVIDER_SITE_OTHER): Payer: BC Managed Care – PPO | Admitting: Obstetrics and Gynecology

## 2024-02-12 ENCOUNTER — Encounter: Payer: Self-pay | Admitting: Obstetrics and Gynecology

## 2024-02-12 VITALS — BP 132/84 | HR 87 | Ht 69.5 in | Wt 233.0 lb

## 2024-02-12 DIAGNOSIS — Z124 Encounter for screening for malignant neoplasm of cervix: Secondary | ICD-10-CM | POA: Diagnosis present

## 2024-02-12 DIAGNOSIS — Z01419 Encounter for gynecological examination (general) (routine) without abnormal findings: Secondary | ICD-10-CM | POA: Diagnosis not present

## 2024-02-12 DIAGNOSIS — Z8742 Personal history of other diseases of the female genital tract: Secondary | ICD-10-CM | POA: Diagnosis not present

## 2024-02-12 DIAGNOSIS — Z1331 Encounter for screening for depression: Secondary | ICD-10-CM | POA: Diagnosis not present

## 2024-02-12 NOTE — Patient Instructions (Signed)

## 2024-02-14 ENCOUNTER — Encounter: Payer: Self-pay | Admitting: Obstetrics and Gynecology

## 2024-02-14 ENCOUNTER — Encounter: Payer: BC Managed Care – PPO | Admitting: Family Medicine

## 2024-02-14 ENCOUNTER — Ambulatory Visit: Payer: Self-pay | Admitting: Family Medicine

## 2024-02-14 ENCOUNTER — Encounter: Payer: Self-pay | Admitting: Family Medicine

## 2024-02-14 VITALS — BP 130/74 | HR 69 | Temp 98.5°F | Resp 16 | Ht 68.5 in | Wt 238.0 lb

## 2024-02-14 DIAGNOSIS — Z1329 Encounter for screening for other suspected endocrine disorder: Secondary | ICD-10-CM

## 2024-02-14 DIAGNOSIS — D509 Iron deficiency anemia, unspecified: Secondary | ICD-10-CM | POA: Diagnosis not present

## 2024-02-14 DIAGNOSIS — Z13228 Encounter for screening for other metabolic disorders: Secondary | ICD-10-CM

## 2024-02-14 DIAGNOSIS — I10 Essential (primary) hypertension: Secondary | ICD-10-CM

## 2024-02-14 DIAGNOSIS — Z130 Encounter for screening for diseases of the blood and blood-forming organs and certain disorders involving the immune mechanism: Secondary | ICD-10-CM | POA: Diagnosis not present

## 2024-02-14 DIAGNOSIS — E559 Vitamin D deficiency, unspecified: Secondary | ICD-10-CM | POA: Diagnosis not present

## 2024-02-14 DIAGNOSIS — E876 Hypokalemia: Secondary | ICD-10-CM

## 2024-02-14 DIAGNOSIS — Z0000 Encounter for general adult medical examination without abnormal findings: Secondary | ICD-10-CM

## 2024-02-14 DIAGNOSIS — E785 Hyperlipidemia, unspecified: Secondary | ICD-10-CM | POA: Diagnosis not present

## 2024-02-14 LAB — CBC
HCT: 37.4 % (ref 36.0–46.0)
Hemoglobin: 12.1 g/dL (ref 12.0–15.0)
MCHC: 32.4 g/dL (ref 30.0–36.0)
MCV: 85.3 fl (ref 78.0–100.0)
Platelets: 516 10*3/uL — ABNORMAL HIGH (ref 150.0–400.0)
RBC: 4.38 Mil/uL (ref 3.87–5.11)
RDW: 14.9 % (ref 11.5–15.5)
WBC: 12 10*3/uL — ABNORMAL HIGH (ref 4.0–10.5)

## 2024-02-14 LAB — COMPREHENSIVE METABOLIC PANEL
ALT: 11 U/L (ref 0–35)
AST: 11 U/L (ref 0–37)
Albumin: 4.3 g/dL (ref 3.5–5.2)
Alkaline Phosphatase: 122 U/L — ABNORMAL HIGH (ref 39–117)
BUN: 13 mg/dL (ref 6–23)
CO2: 30 meq/L (ref 19–32)
Calcium: 9.3 mg/dL (ref 8.4–10.5)
Chloride: 98 meq/L (ref 96–112)
Creatinine, Ser: 0.79 mg/dL (ref 0.40–1.20)
GFR: 80.98 mL/min (ref >60.00)
Glucose, Bld: 90 mg/dL (ref 70–99)
Potassium: 3 meq/L — ABNORMAL LOW (ref 3.5–5.1)
Sodium: 138 meq/L (ref 135–145)
Total Bilirubin: 0.6 mg/dL (ref 0.2–1.2)
Total Protein: 7.8 g/dL (ref 6.0–8.3)

## 2024-02-14 LAB — LIPID PANEL
Cholesterol: 270 mg/dL — ABNORMAL HIGH (ref 0–200)
HDL: 48.2 mg/dL (ref >39.00)
LDL Cholesterol: 198 mg/dL — ABNORMAL HIGH (ref 0–99)
NonHDL: 221.96
Total CHOL/HDL Ratio: 6
Triglycerides: 121 mg/dL (ref 0.0–149.0)
VLDL: 24.2 mg/dL (ref 0.0–40.0)

## 2024-02-14 LAB — SURGICAL PATHOLOGY

## 2024-02-14 LAB — HEMOGLOBIN A1C: Hgb A1c MFr Bld: 4.9 % (ref 4.6–6.5)

## 2024-02-14 LAB — IRON: Iron: 47 ug/dL (ref 42–145)

## 2024-02-14 LAB — FERRITIN: Ferritin: 182.8 ng/mL (ref 10.0–291.0)

## 2024-02-14 LAB — VITAMIN D 25 HYDROXY (VIT D DEFICIENCY, FRACTURES): VITD: 27.84 ng/mL — ABNORMAL LOW (ref 30.00–100.00)

## 2024-02-14 MED ORDER — LOSARTAN POTASSIUM-HCTZ 100-25 MG PO TABS: 1.0000 | ORAL_TABLET | Freq: Every day | ORAL | 3 refills | Status: DC

## 2024-02-14 NOTE — Progress Notes (Signed)
 HPI: [name] is a 63 y.o. female with a PMHx significant for HTN, HLD, allergies, UC, and anemia, who is here today for her routine physical.  Last CPE: 02/13/2023.   Exercise: Patient admits she has not been exercising.  Diet: She is eating at home and eating vegetables most days.  Sleep: At least 8 hours per night.   Alcohol Use: none Smoking: She quit smoking in 1999.  Vision: UTD on routine vision care.  Dental: UTD on routine dental care.   Gynecology: She had an appointment with gynecology on 3/12.   Immunization History  Administered Date(s) Administered   Influenza Inj Mdck Quad Pf 09/03/2019   Influenza, Seasonal, Injecte, Preservative Fre 09/05/2023   Influenza,inj,Quad PF,6+ Mos 09/10/2022   Tdap 09/02/2017   Health Maintenance  Topic Date Due   COVID-19 Vaccine (1) 03/01/2024 (Originally 02/19/1966)   Zoster Vaccines- Shingrix (1 of 2) 05/16/2024 (Originally [birth year]/18/1982)   MAMMOGRAM  10/10/2025   DTaP/Tdap/Td (2 - Td or Tdap) 09/03/2027   Cervical Cancer Screening (HPV/Pap Cotest)  12/28/2027   Colonoscopy  12/30/2030   INFLUENZA VACCINE  Completed   Hepatitis C Screening  Completed   HPV VACCINES  Aged Out   HIV Screening  Discontinued   Chronic medical problems:   Hypertension:  Medications: Currently on losartan-hydrochlorothiazide 100-25 mg daily.  She doesn't check her BP regularly at home.  Negative for unusual or severe headache, exertional chest pain, dyspnea, or edema.  Lab Results  Component Value Date   CREATININE 0.86 02/13/2023   BUN 14 02/13/2023   NA 137 02/13/2023   K 3.6 02/13/2023   CL 100 02/13/2023   CO2 29 02/13/2023   Anemia:  No longer following with hematology. She is not taking iron supplementation.     Latest Ref Rng & Units 02/13/2023    8:03 AM 04/09/2022   10:44 AM 04/02/2022    2:09 PM  CBC  WBC 4.0 - 10.5 K/uL 12.4  11.1  14.4   Hemoglobin 12.0 - 15.0 g/dL 16.1  09.6  04.5   Hematocrit 36.0 - 46.0 % 35.9   41.7  38.7   Platelets 150.0 - 400.0 K/uL 521.0  520.0  535    Ulcerative Collitis:  She still sees gastroenterology once per year, most recently in 06/2023.  Denies any blood in her stool.   Vitamin D deficiency: She takes vitamin D 1000 units daily.  Lab Results  Component Value Date   VD25OH 24.39 (L) 02/13/2023   No new concerns or problems today.   Review of Systems  Constitutional:  Negative for activity change, appetite change, fatigue and fever.  HENT:  Negative for mouth sores, sore throat and trouble swallowing.   Eyes:  Negative for redness and visual disturbance.  Respiratory:  Negative for cough, shortness of breath and wheezing.   Cardiovascular:  Negative for chest pain and leg swelling.  Gastrointestinal:  Negative for abdominal pain, nausea and vomiting.       No changes in bowel habits.  Endocrine: Negative for cold intolerance, heat intolerance, polydipsia, polyphagia and polyuria.  Genitourinary:  Negative for decreased urine volume, dysuria and hematuria.  Musculoskeletal:  Negative for gait problem and myalgias.  Skin:  Negative for color change and rash.  Allergic/Immunologic: Negative for environmental allergies.  Neurological:  Negative for syncope, weakness and headaches.  Hematological:  Negative for adenopathy. Does not bruise/bleed easily.  Psychiatric/Behavioral:  Negative for confusion. The patient is not nervous/anxious.   All other  systems reviewed and are negative.   Current Outpatient Medications on File Prior to Visit  Medication Sig Dispense Refill   cholecalciferol (VITAMIN D3) 25 MCG (1000 UNIT) tablet Take 1,000 Units by mouth daily.     mesalamine (APRISO) 0.375 g 24 hr capsule Take 1,500 mg by mouth every morning.      OVER THE COUNTER MEDICATION Take by mouth daily. Black Seed Oil     No current facility-administered medications on file prior to visit.   Past Medical History:  Diagnosis Date   Arthritis    knees   Chest tightness     Heart murmur    per pt had echo done yrs ago was told mild   History of hyperprolactinemia    02/ 2018  resolved   Hyperlipidemia    Hypertension    Iron deficiency anemia    Low back pain    Menometrorrhagia    REFRACTORY TO PROGESTIN   Sinus tachycardia    Ulcerative colitis (HCC) followed by [facility]   per pt dx 1990s   Wears glasses    Past Surgical History:  Procedure Laterality Date   CESAREAN SECTION  1988   ECTOPIC PREGNANCY SURGERY  2002 approx.   right partial salpingectomy via lapartomy   EXPLORATORY LAPAROTOMY W/ LEFT SALPINGECTOMY FOR ECTOPIC  03-31-2003   dr Antionette Char   INTRAUTERINE DEVICE (IUD) INSERTION     mirena inserted 08-13-17   No Known Allergies  Family History  Problem Relation Age of Onset   Angina Mother    Thyroid disease Mother    Arthritis Mother    Heart disease Mother    Hypertension Mother    Hyperlipidemia Mother    Prostate cancer Father    Arthritis Father    Cancer Father        prostate   Hypertension Father    Cancer Maternal Aunt        breast   Breast cancer Maternal Aunt    Breast cancer Cousin    Social History   Socioeconomic History   Marital status: Married    Spouse name: Amy Coffey   Number of children: 1   Years of education: BA   Highest education level: Not on file  Occupational History   Occupation: Community education officer  Tobacco Use   Smoking status: Former    Current packs/day: 0.00    Types: Cigarettes    Start date: 12/04/1987    Quit date: 12/03/1997    Years since quitting: 26.2   Smokeless tobacco: Never  Vaping Use   Vaping status: Never Used  Substance and Sexual Activity   Alcohol use: No   Drug use: No   Sexual activity: Yes    Partners: Male    Birth control/protection: Post-menopausal    Comment: First IC @ 4 y/o, >5 Partners  Other Topics Concern   Not on file  Social History Narrative   Lives with husband   Caffeine use: coffee daily   [employer] on weekends   Social Drivers of Manufacturing engineer Strain: Not on file  Food Insecurity: Not on file  Transportation Needs: Not on file  Physical Activity: Not on file  Stress: Not on file  Social Connections: Not on file   Vitals:   02/14/24 0840  BP: 130/74  Pulse: 69  Resp: 16  Temp: 98.5 F (36.9 C)  SpO2: 98%   Body mass index is 35.66 kg/m.  Wt Readings from Last 3 Encounters:  02/14/24 238 lb (108 kg)  02/12/24 233 lb (105.7 kg)  11/13/23 238 lb (108 kg)   Physical Exam Vitals and nursing note reviewed.  Constitutional:      General: She is not in acute distress.    Appearance: She is well-developed.  HENT:     Head: Normocephalic and atraumatic.     Right Ear: External ear normal. Tympanic membrane is not erythematous.     Left Ear: Tympanic membrane, ear canal and external ear normal.     Ears:     Comments: Cerumen excess right ear canal , TM seen partially.    Mouth/Throat:     Mouth: Mucous membranes are moist.     Pharynx: Oropharynx is clear. Uvula midline.  Eyes:     Extraocular Movements: Extraocular movements intact.     Conjunctiva/sclera: Conjunctivae normal.     Pupils: Pupils are equal, round, and reactive to light.  Neck:     Thyroid: Thyromegaly (palpable, symmetric.) present. No thyroid mass.  Cardiovascular:     Rate and Rhythm: Normal rate and regular rhythm.     Pulses:          Dorsalis pedis pulses are 2+ on the right side and 2+ on the left side.     Heart sounds: No murmur heard. Pulmonary:     Effort: Pulmonary effort is normal. No respiratory distress.     Breath sounds: Normal breath sounds.  Abdominal:     Palpations: Abdomen is soft. There is no hepatomegaly or mass.     Tenderness: There is no abdominal tenderness.  Genitourinary:    Comments: Deferred to gyn. Musculoskeletal:     Right lower leg: No edema.     Left lower leg: No edema.     Comments: No major deformity or signs of synovitis appreciated.  Lymphadenopathy:     Cervical: No cervical  adenopathy.     Upper Body:     Right upper body: No supraclavicular adenopathy.     Left upper body: No supraclavicular adenopathy.  Skin:    General: Skin is warm.     Findings: No erythema or rash.  Neurological:     General: No focal deficit present.     Mental Status: She is alert and oriented to person, place, and time.     Cranial Nerves: No cranial nerve deficit.     Sensory: No sensory deficit.     Motor: No weakness.     Coordination: Coordination normal.     Gait: Gait normal.     Deep Tendon Reflexes:     Reflex Scores:      Bicep reflexes are 2+ on the right side and 2+ on the left side.      Patellar reflexes are 2+ on the right side and 2+ on the left side.    Comments: DTRs 1-2+ symmetric bilateral  Psychiatric:        Mood and Affect: Mood and affect normal.   ASSESSMENT AND PLAN:  Ms. NISSI DOFFING was here today for her annual physical examination.  Orders Placed This Encounter  Procedures   Comprehensive metabolic panel   CBC   Lipid panel   Hemoglobin A1c   VITAMIN D 25 Hydroxy (Vit-D Deficiency, Fractures)   Iron   Ferritin   Lab Results  Component Value Date   WBC 12.0 (H) 02/14/2024   HGB 12.1 02/14/2024   HCT 37.4 02/14/2024   MCV 85.3 02/14/2024   PLT 516.0 (H) 02/14/2024  Lab Results  Component Value Date   NA 138 02/14/2024   CL 98 02/14/2024   K 3.0 (L) 02/14/2024   CO2 30 02/14/2024   BUN 13 02/14/2024   CREATININE 0.79 02/14/2024   GFR 80.98 02/14/2024   CALCIUM 9.3 02/14/2024   ALBUMIN 4.3 02/14/2024   GLUCOSE 90 02/14/2024   Lab Results  Component Value Date   ALT 11 02/14/2024   AST 11 02/14/2024   ALKPHOS 122 (H) 02/14/2024   BILITOT 0.6 02/14/2024   Lab Results  Component Value Date   HGBA1C 4.9 02/14/2024   Lab Results  Component Value Date   VD25OH 27.84 (L) 02/14/2024   Routine general medical examination at a health care facility Assessment & Plan: We discussed the importance of regular physical  activity and healthy diet for prevention of chronic illness and/or complications. Preventive guidelines reviewed. Vaccination: Prefers to hold on Prevnar 20 and Shingrix. Ca++ and vit D supplementation to continue. Continue her female preventive care with gynecologist. Next CPE in a year.   Vitamin D deficiency, unspecified Assessment & Plan: Continue OTC vitamin D at 1000 units daily. Further recommendation will be given according to 25 OH vitamin D result.  Orders: -     Comprehensive metabolic panel; Future -     VITAMIN D 25 Hydroxy (Vit-D Deficiency, Fractures); Future  Hyperlipidemia, unspecified hyperlipidemia type Assessment & Plan: Continue nonpharmacologic treatment, in the past rosuvastatin has been recommended. Further recommendations will be given according to 10 years CVD risk score and lipid panel numbers.  Orders: -     Comprehensive metabolic panel; Future -     Lipid panel; Future  Hypertension, essential Assessment & Plan: BP otherwise adequately controlled. Recommend monitoring BP at least once or twice per month. Continue losartan-HCTZ 100-25 mg daily and low-salt diet. Eye exam is current.  Orders: -     Comprehensive metabolic panel; Future -     Losartan Potassium-HCTZ; Take 1 tablet by mouth daily.  Dispense: 90 tablet; Refill: 3  Iron deficiency anemia, unspecified iron deficiency anemia type Assessment & Plan: She is not taking Fe Sulfate and not longer following with hematologist. Further recommendations according to CBC and iron studies results.  Orders: -     CBC; Future -     Iron; Future -     Ferritin; Future  Screening for endocrine, metabolic and immunity disorder -     Hemoglobin A1c; Future   Return in 1 year (on 02/13/2025) for CPE, Labs, chronic problems.  I, Rolla Etienne Wierda, acting as a scribe for Dam Ashraf [place], MD., have documented all relevant documentation on the behalf of Renise Gillies [place], MD, as directed by  Alexandar Weisenberger [place], MD  while in the presence of Alita Waldren [place], MD.   I, Mardell Cragg [place], MD, have reviewed all documentation for this visit. The documentation on 02/14/24 for the exam, diagnosis, procedures, and orders are all accurate and complete.  Ormand Senn G. [place], MD  [facility]. [place] office.

## 2024-02-14 NOTE — Assessment & Plan Note (Signed)
 We discussed the importance of regular physical activity and healthy diet for prevention of chronic illness and/or complications. Preventive guidelines reviewed. Vaccination: Prefers to hold on Prevnar 20 and Shingrix. Ca++ and vit D supplementation to continue. Continue her female preventive care with gynecologist. Next CPE in a year.

## 2024-02-14 NOTE — Patient Instructions (Addendum)
 A few things to remember from today's visit:  Routine general medical examination at a health care facility  Vitamin D deficiency, unspecified - Plan: Comprehensive metabolic panel, VITAMIN D 25 Hydroxy (Vit-D Deficiency, Fractures)  Hyperlipidemia, unspecified hyperlipidemia type - Plan: Comprehensive metabolic panel, Lipid panel  Hypertension, essential - Plan: Comprehensive metabolic panel, losartan-hydrochlorothiazide (HYZAAR) 100-25 MG tablet  Iron deficiency anemia, unspecified iron deficiency anemia type - Plan: CBC, Iron, Ferritin  Screening for endocrine, metabolic and immunity disorder - Plan: Hemoglobin A1c  Monitor blood pressure at home 1-2 per months at least.  If you need refills for medications you take chronically, please call your pharmacy. Do not use My Chart to request refills or for acute issues that need immediate attention. If you send a my chart message, it may take a few days to be addressed, specially if I am not in the office.  Please be sure medication list is accurate. If a new problem present, please set up appointment sooner than planned today.  Health Maintenance, Female Adopting a healthy lifestyle and getting preventive care are important in promoting health and wellness. Ask your health care provider about: The right schedule for you to have regular tests and exams. Things you can do on your own to prevent diseases and keep yourself healthy. What should I know about diet, weight, and exercise? Eat a healthy diet  Eat a diet that includes plenty of vegetables, fruits, low-fat dairy products, and lean protein. Do not eat a lot of foods that are high in solid fats, added sugars, or sodium. Maintain a healthy weight Body mass index (BMI) is used to identify weight problems. It estimates body fat based on height and weight. Your health care provider can help determine your BMI and help you achieve or maintain a healthy weight. Get regular exercise Get  regular exercise. This is one of the most important things you can do for your health. Most adults should: Exercise for at least 150 minutes each week. The exercise should increase your heart rate and make you sweat (moderate-intensity exercise). Do strengthening exercises at least twice a week. This is in addition to the moderate-intensity exercise. Spend less time sitting. Even light physical activity can be beneficial. Watch cholesterol and blood lipids Have your blood tested for lipids and cholesterol at 61 years of age, then have this test every 5 years. Have your cholesterol levels checked more often if: Your lipid or cholesterol levels are high. You are older than 61 years of age. You are at high risk for heart disease. What should I know about cancer screening? Depending on your health history and family history, you may need to have cancer screening at various ages. This may include screening for: Breast cancer. Cervical cancer. Colorectal cancer. Skin cancer. Lung cancer. What should I know about heart disease, diabetes, and high blood pressure? Blood pressure and heart disease High blood pressure causes heart disease and increases the risk of stroke. This is more likely to develop in people who have high blood pressure readings or are overweight. Have your blood pressure checked: Every 3-5 years if you are 12-71 years of age. Every year if you are 56 years old or older. Diabetes Have regular diabetes screenings. This checks your fasting blood sugar level. Have the screening done: Once every three years after age 11 if you are at a normal weight and have a low risk for diabetes. More often and at a younger age if you are overweight or have a high  risk for diabetes. What should I know about preventing infection? Hepatitis B If you have a higher risk for hepatitis B, you should be screened for this virus. Talk with your health care provider to find out if you are at risk for  hepatitis B infection. Hepatitis C Testing is recommended for: Everyone born from [birth year] through 1963. Anyone with known risk factors for hepatitis C. Sexually transmitted infections (STIs) Get screened for STIs, including gonorrhea and chlamydia, if: You are sexually active and are younger than 61 years of age. You are older than 61 years of age and your health care provider tells you that you are at risk for this type of infection. Your sexual activity has changed since you were last screened, and you are at increased risk for chlamydia or gonorrhea. Ask your health care provider if you are at risk. Ask your health care provider about whether you are at high risk for HIV. Your health care provider may recommend a prescription medicine to help prevent HIV infection. If you choose to take medicine to prevent HIV, you should first get tested for HIV. You should then be tested every 3 months for as long as you are taking the medicine. Pregnancy If you are about to stop having your period (premenopausal) and you may become pregnant, seek counseling before you get pregnant. Take 400 to 800 micrograms (mcg) of folic acid every day if you become pregnant. Ask for birth control (contraception) if you want to prevent pregnancy. Osteoporosis and menopause Osteoporosis is a disease in which the bones lose minerals and strength with aging. This can result in bone fractures. If you are 64 years old or older, or if you are at risk for osteoporosis and fractures, ask your health care provider if you should: Be screened for bone loss. Take a calcium or vitamin D supplement to lower your risk of fractures. Be given hormone replacement therapy (HRT) to treat symptoms of menopause. Follow these instructions at home: Alcohol use Do not drink alcohol if: Your health care provider tells you not to drink. You are pregnant, may be pregnant, or are planning to become pregnant. If you drink alcohol: Limit how much  you have to: 0-1 drink a day. Know how much alcohol is in your drink. In the [place] one drink equals one 12 oz bottle of beer (355 mL), one 5 oz glass of wine (148 mL), or one 1 oz glass of hard liquor (44 mL). Lifestyle Do not use any products that contain nicotine or tobacco. These products include cigarettes, chewing tobacco, and vaping devices, such as e-cigarettes. If you need help quitting, ask your health care provider. Do not use street drugs. Do not share needles. Ask your health care provider for help if you need support or information about quitting drugs. General instructions Schedule regular health, dental, and eye exams. Stay current with your vaccines. Tell your health care provider if: You often feel depressed. You have ever been abused or do not feel safe at home. Summary Adopting a healthy lifestyle and getting preventive care are important in promoting health and wellness. Follow your health care provider's instructions about healthy diet, exercising, and getting tested or screened for diseases. Follow your health care provider's instructions on monitoring your cholesterol and blood pressure. This information is not intended to replace advice given to you by your health care provider. Make sure you discuss any questions you have with your health care provider. Document Revised: 04/10/2021 Document Reviewed: 04/10/2021 Elsevier Patient Education  2024 [employer]

## 2024-02-14 NOTE — Assessment & Plan Note (Signed)
 Continue nonpharmacologic treatment, in the past rosuvastatin has been recommended. Further recommendations will be given according to 10 years CVD risk score and lipid panel numbers.

## 2024-02-14 NOTE — Assessment & Plan Note (Signed)
 BP otherwise adequately controlled. Recommend monitoring BP at least once or twice per month. Continue losartan-HCTZ 100-25 mg daily and low-salt diet. Eye exam is current.

## 2024-02-14 NOTE — Assessment & Plan Note (Signed)
Continue OTC vitamin D at 1000 units daily. Further recommendation will be given according to 25 OH vitamin D result.

## 2024-02-14 NOTE — Assessment & Plan Note (Signed)
 She is not taking Fe Sulfate and not longer following with hematologist. Further recommendations according to CBC and iron studies results.

## 2024-02-15 MED ORDER — POTASSIUM CHLORIDE ER 10 MEQ PO TBCR: 20.0000 meq | EXTENDED_RELEASE_TABLET | Freq: Every day | ORAL | 0 refills | Status: DC

## 2024-02-17 ENCOUNTER — Encounter: Payer: Self-pay | Admitting: Obstetrics and Gynecology

## 2024-02-17 ENCOUNTER — Encounter: Payer: Self-pay | Admitting: Family Medicine

## 2024-02-17 LAB — CYTOLOGY - PAP
Adequacy: ABSENT
Comment: NEGATIVE
Diagnosis: NEGATIVE
High risk HPV: NEGATIVE

## 2024-02-17 MED ORDER — ROSUVASTATIN CALCIUM 10 MG PO TABS: 10.0000 mg | ORAL_TABLET | Freq: Every day | ORAL | 3 refills | Status: DC

## 2024-03-18 ENCOUNTER — Other Ambulatory Visit

## 2024-03-19 ENCOUNTER — Other Ambulatory Visit: Payer: Self-pay | Admitting: Family Medicine

## 2024-03-19 ENCOUNTER — Encounter: Payer: Self-pay | Admitting: Family Medicine

## 2024-03-19 ENCOUNTER — Other Ambulatory Visit

## 2024-03-19 DIAGNOSIS — E876 Hypokalemia: Secondary | ICD-10-CM | POA: Diagnosis not present

## 2024-03-19 LAB — POTASSIUM: Potassium: 3.4 meq/L — ABNORMAL LOW (ref 3.5–5.1)

## 2024-03-19 MED ORDER — POTASSIUM CHLORIDE CRYS ER 10 MEQ PO TBCR: 10.0000 meq | EXTENDED_RELEASE_TABLET | Freq: Every day | ORAL | 0 refills | Status: DC

## 2024-03-19 NOTE — Progress Notes (Signed)
 Initial order was expired and her PCP, Dr [place] is out office. Patient is in office to have lab drawn.

## 2024-03-19 NOTE — Progress Notes (Signed)
 Patient needs potassium level redrawn due to order has expired. Dr. [place] is not in office and patient is in office today.

## 2024-03-19 NOTE — Addendum Note (Signed)
 Addended by: Teodoro Feller B on: 03/19/2024 05:03 PM   Modules accepted: Orders

## 2024-03-19 NOTE — Addendum Note (Signed)
 Addended by: Teodoro Feller B on: 03/19/2024 04:59 PM   Modules accepted: Orders

## 2024-03-24 ENCOUNTER — Other Ambulatory Visit: Payer: Self-pay | Admitting: Family Medicine

## 2024-03-24 DIAGNOSIS — I10 Essential (primary) hypertension: Secondary | ICD-10-CM

## 2024-03-24 MED ORDER — LOSARTAN POTASSIUM 100 MG PO TABS: 100.0000 mg | ORAL_TABLET | Freq: Every day | ORAL | 2 refills | Status: DC

## 2024-03-25 ENCOUNTER — Telehealth: Payer: Self-pay | Admitting: Family Medicine

## 2024-03-25 ENCOUNTER — Telehealth: Payer: Self-pay

## 2024-03-25 NOTE — Telephone Encounter (Signed)
 Copied from CRM [id number]. Topic: Clinical - Request for Lab/Test Order >> Mar 25, 2024  8:05 AM Juluis Ok wrote: Reason for CRM: Patient requesting to have labs scheduled for potassium. Please contact patient for scheduling. Callback # [phone]

## 2024-03-25 NOTE — Telephone Encounter (Signed)
 Called patient to review questions/ requests for diuretics medication prn. No answer, LVMTCB [phone].       Copied from CRM [id number]. Topic: Clinical - Medication Question >> Mar 25, 2024  8:10 AM Juluis Ok wrote: Reason for CRM: Patient is requesting to have a water pill prescribed PRN. She is also requesting a callback from provider/nurse to discuss various diuretic medications.

## 2024-03-25 NOTE — Telephone Encounter (Signed)
 Responded to pt via mychart requesting her to call and schedule appt with pcp.

## 2024-04-03 ENCOUNTER — Ambulatory Visit: Admitting: Family Medicine

## 2024-04-03 ENCOUNTER — Encounter: Payer: Self-pay | Admitting: Family Medicine

## 2024-04-03 VITALS — BP 150/95 | HR 91 | Temp 97.8°F | Resp 16 | Ht 68.5 in | Wt 238.8 lb

## 2024-04-03 DIAGNOSIS — R252 Cramp and spasm: Secondary | ICD-10-CM | POA: Diagnosis not present

## 2024-04-03 DIAGNOSIS — E876 Hypokalemia: Secondary | ICD-10-CM

## 2024-04-03 DIAGNOSIS — I10 Essential (primary) hypertension: Secondary | ICD-10-CM | POA: Diagnosis not present

## 2024-04-03 LAB — POTASSIUM: Potassium: 3.6 meq/L (ref 3.5–5.1)

## 2024-04-03 MED ORDER — LOSARTAN POTASSIUM-HCTZ 100-25 MG PO TABS
1.0000 | ORAL_TABLET | Freq: Every day | ORAL | 2 refills | Status: AC
Start: 2024-04-03 — End: ?

## 2024-04-03 NOTE — Assessment & Plan Note (Signed)
 She completed treatment with K-Lor 20 mg daily about 2 days ago.  She would like to continue HCTZ for hypertension, we discussed side effects. Further recommendation will be given according to potassium level. Continue DASH diet.

## 2024-04-03 NOTE — Progress Notes (Signed)
 HPI: [name] is a 63 y.o. female with a PMHx significant for HTN, HLD, allergies, UC, and anemia, who is here today for medication management.  Last seen on 02/14/2024  Hypertension:  Medications: Currently on losartan -hydrochlorothiazide  100-25 mg. She says she is taking it on weekdays but not weekends.   BP readings at home: She has not been checking her BP at home. It is elevated in the office today at 150/92, but she says she hasn't taken her medication since yesterday morning.  She does not take medication weakness.  Cramps: She thought HCTZ was causing the cramps, so recommended discontinuing it but she did not do so.  She has been taking potassium supplementation which seems to help with her feet cramps. However, she has recently started to take black seed oil and has noticed the cramps returning.   Negative for unusual or severe headache, visual changes, exertional chest pain, dyspnea, palpitations, focal weakness, or edema.  Hypokalemia: Completed K-Lor 20 mK.  Lab Results  Component Value Date   CREATININE 0.79 02/14/2024   BUN 13 02/14/2024   NA 138 02/14/2024   K 3.4 (L) 03/19/2024   CL 98 02/14/2024   CO2 30 02/14/2024   Review of Systems  Constitutional:  Negative for activity change, appetite change and fever.  HENT:  Negative for mouth sores and sore throat.   Respiratory:  Negative for cough and wheezing.   Gastrointestinal:  Negative for abdominal pain, nausea and vomiting.       Negative for changes in bowel habits.  Genitourinary:  Negative for decreased urine volume and hematuria.  Musculoskeletal:  Negative for joint swelling.  Skin:  Negative for rash.  Neurological:  Negative for syncope and facial asymmetry.  See other pertinent positives and negatives in HPI.  Current Outpatient Medications on File Prior to Visit  Medication Sig Dispense Refill   cholecalciferol (VITAMIN D3) 25 MCG (1000 UNIT) tablet Take 1,000 Units by mouth daily.      mesalamine (APRISO) 0.375 g 24 hr capsule Take 1,500 mg by mouth every morning.      OVER THE COUNTER MEDICATION Take by mouth daily. Black Seed Oil     rosuvastatin  (CRESTOR ) 10 MG tablet Take 1 tablet (10 mg total) by mouth daily. 90 tablet 3   No current facility-administered medications on file prior to visit.   Past Medical History:  Diagnosis Date   Arthritis    knees   Chest tightness    Heart murmur    per pt had echo done yrs ago was told mild   History of hyperprolactinemia    02/ 2018  resolved   Hyperlipidemia    Hypertension    Iron deficiency anemia    Low back pain    Menometrorrhagia    REFRACTORY TO PROGESTIN   Sinus tachycardia    Ulcerative colitis (HCC) followed by [facility]   per pt dx 1990s   Wears glasses    No Known Allergies  Social History   Socioeconomic History   Marital status: Married    Spouse name: Arnetta Lank   Number of children: 1   Years of education: BA   Highest education level: Not on file  Occupational History   Occupation: Community education officer  Tobacco Use   Smoking status: Former    Current packs/day: 0.00    Types: Cigarettes    Start date: 12/04/1987    Quit date: 12/03/1997    Years since quitting: 26.3   Smokeless tobacco: Never  Vaping Use   Vaping status: Never Used  Substance and Sexual Activity   Alcohol use: No   Drug use: No   Sexual activity: Yes    Partners: Male    Birth control/protection: Post-menopausal    Comment: First IC @ 20 y/o, >5 Partners  Other Topics Concern   Not on file  Social History Narrative   Lives with husband   Caffeine use: coffee daily   Pepsi on weekends   Social Drivers of Health   Financial Resource Strain: Not on file  Food Insecurity: Not on file  Transportation Needs: Not on file  Physical Activity: Not on file  Stress: Not on file  Social Connections: Not on file   Vitals:   04/03/24 1137 04/03/24 1207  BP: (!) 150/92 (!) 150/95  Pulse: 91   Resp: 16   Temp: 97.8 F (36.6 C)    SpO2: 99%    Body mass index is 35.78 kg/m.  Physical Exam Vitals and nursing note reviewed.  Constitutional:      General: She is not in acute distress.    Appearance: She is well-developed.  HENT:     Head: Normocephalic and atraumatic.  Eyes:     Conjunctiva/sclera: Conjunctivae normal.  Cardiovascular:     Rate and Rhythm: Normal rate and regular rhythm.     Pulses:          Dorsalis pedis pulses are 2+ on the right side and 2+ on the left side.     Heart sounds: No murmur heard. Pulmonary:     Effort: Pulmonary effort is normal. No respiratory distress.     Breath sounds: Normal breath sounds.  Abdominal:     Palpations: Abdomen is soft. There is no mass.     Tenderness: There is no abdominal tenderness.  Musculoskeletal:     Right lower leg: No tenderness. No edema.     Left lower leg: No tenderness. No edema.  Skin:    General: Skin is warm.     Findings: No erythema or rash.  Neurological:     General: No focal deficit present.     Mental Status: She is alert and oriented to person, place, and time.     Cranial Nerves: No cranial nerve deficit.     Gait: Gait normal.  Psychiatric:        Mood and Affect: Mood and affect normal.    ASSESSMENT AND PLAN:  [name] was seen today for medication management.   Cramping of feet Assessment & Plan: Chronic. We discussed possible etiologies. She was attributing problem to HCTZ but noticed that problem started when she started taking Black oil seed over-the-counter and improved with decreasing its dose. She also felt like potassium supplementation helped, so we may resume it. Further recommendations according to lab results.   Hypokalemia Assessment & Plan: She completed treatment with K-Lor 20 mg daily about 2 days ago.  She would like to continue HCTZ for hypertension, we discussed side effects. Further recommendation will be given according to potassium level. Continue DASH diet.  Orders: -      Potassium -     Losartan  Potassium-HCTZ; Take 1 tablet by mouth daily.  Dispense: 90 tablet; Refill: 2  Hypertension, essential Assessment & Plan: BP is elevated today, she has not taken her medication, last dose yesterday morning. She is skipping antihypertensive medication during weekends. Stressed importance of taking medication daily, we discussed possible complications of elevated BP. She is not checking BP  at home, recommend doing so. Continue losartan -HCTZ 100-25 mg daily. Continue low-salt diet.   Return if symptoms worsen or fail to improve, for keep next appointment.  I, Fritz Jewel Wierda, acting as a scribe for Fabrice Dyal [place], MD., have documented all relevant documentation on the behalf of Sway Guttierrez [place], MD, as directed by  Ambri Miltner [place], MD while in the presence of Caden Fatica [place], MD.   I, Hart Haas [place], MD, have reviewed all documentation for this visit. The documentation on 04/03/24 for the exam, diagnosis, procedures, and orders are all accurate and complete.  Marlicia Sroka G. [place], MD  [facility]. [place] office.

## 2024-04-03 NOTE — Assessment & Plan Note (Addendum)
 BP is elevated today, she has not taken her medication, last dose yesterday morning. She is skipping antihypertensive medication during weekends. Stressed importance of taking medication daily, we discussed possible complications of elevated BP. She is not checking BP at home, recommend doing so. Continue losartan -HCTZ 100-25 mg daily. Continue low-salt diet.

## 2024-04-03 NOTE — Assessment & Plan Note (Signed)
 Chronic. We discussed possible etiologies. She was attributing problem to HCTZ but noticed that problem started when she started taking Black oil seed over-the-counter and improved with decreasing its dose. She also felt like potassium supplementation helped, so we may resume it. Further recommendations according to lab results.

## 2024-04-06 ENCOUNTER — Encounter: Payer: Self-pay | Admitting: Family Medicine

## 2024-04-10 DIAGNOSIS — M1711 Unilateral primary osteoarthritis, right knee: Secondary | ICD-10-CM | POA: Diagnosis not present

## 2024-04-23 ENCOUNTER — Encounter: Payer: Self-pay | Admitting: Family Medicine

## 2024-06-04 ENCOUNTER — Telehealth: Payer: Self-pay

## 2024-06-04 MED ORDER — NYSTATIN 100000 UNIT/GM EX POWD: 1.0000 | Freq: Three times a day (TID) | CUTANEOUS | 0 refills | Status: AC

## 2024-06-04 NOTE — Telephone Encounter (Signed)
 She can try corn starch if she wants to try to keep the area dry.   I will send in a prescription for Nystatin  powder which she can use if she develops heat rash.

## 2024-06-04 NOTE — Telephone Encounter (Signed)
 Patient notified

## 2024-06-04 NOTE — Telephone Encounter (Signed)
 Patient called asking if she can get a rx for nystatin  powder or something that she can put under her breast from sweating. Routing to Dr. Dallie covering provider.

## 2024-06-08 ENCOUNTER — Encounter: Payer: Self-pay | Admitting: Family Medicine

## 2024-06-08 ENCOUNTER — Ambulatory Visit: Admitting: Family Medicine

## 2024-06-08 VITALS — BP 154/84 | HR 80 | Temp 98.0°F | Ht 68.0 in | Wt 240.0 lb

## 2024-06-08 DIAGNOSIS — M545 Low back pain, unspecified: Secondary | ICD-10-CM

## 2024-06-08 DIAGNOSIS — R350 Frequency of micturition: Secondary | ICD-10-CM

## 2024-06-08 LAB — POCT URINALYSIS DIPSTICK
Bilirubin, UA: NEGATIVE
Blood, UA: NEGATIVE
Glucose, UA: NEGATIVE
Ketones, UA: NEGATIVE
Leukocytes, UA: NEGATIVE
Nitrite, UA: NEGATIVE
Protein, UA: NEGATIVE
Spec Grav, UA: 1.015 (ref 1.010–1.025)
Urobilinogen, UA: 0.2 U/dL
pH, UA: 6 (ref 5.0–8.0)

## 2024-06-08 MED ORDER — METHOCARBAMOL 500 MG PO TABS: 500.0000 mg | ORAL_TABLET | Freq: Three times a day (TID) | ORAL | 0 refills | Status: AC | PRN

## 2024-06-08 NOTE — Progress Notes (Signed)
 Acute Office Visit   Subjective:  Patient ID: Amy Coffey, female    DOB: June 07, 1961, 63 y.o.   MRN: [id number]  Chief Complaint  Patient presents with   Back Pain    Lower back pain    Back Pain   Patient is here for an acute visit. Patient is complaining of lumbar pain that started on Friday. Pain become worse on Saturday and Sunday. Pain mostly on the right side, but a little on the left side. Pain described as soreness, a little burning yesterday. Unsure if it is related to a urinary tract infection or muscle back pain. Patient reports she started drinking cranberry juice on Friday with thoughts it could be a UTI. Also, when getting a shower today and on Saturday, she has a cramp in her right thoracic area. She reports once she stretched the pain improved.  Reports a little right lower abd pain initially until she started drinking cranberry juice.   Denies fever, dysuria, hematuria, but reports increase in frequency. She reports one reason why she says it could be a pulled muscle because she lifted some water up on Thursday.  Review of Systems  Musculoskeletal:  Positive for back pain.   See HPI above      Objective:   BP (!) 154/84   Pulse 80   Temp 98 F (36.7 C) (Oral)   Ht 5' 8 (1.727 m)   Wt 240 lb (108.9 kg)   LMP  (LMP Unknown)   SpO2 98%   BMI 36.49 kg/m    Physical Exam Vitals reviewed.  Constitutional:      General: She is not in acute distress.    Appearance: Normal appearance. She is obese. She is not ill-appearing, toxic-appearing or diaphoretic.  HENT:     Head: Normocephalic and atraumatic.  Eyes:     General:        Right eye: No discharge.        Left eye: No discharge.     Conjunctiva/sclera: Conjunctivae normal.  Cardiovascular:     Rate and Rhythm: Normal rate and regular rhythm.     Heart sounds: Normal heart sounds. No murmur heard.    No friction rub. No gallop.  Pulmonary:     Effort: Pulmonary effort is normal. No respiratory  distress.     Breath sounds: Normal breath sounds.  Abdominal:     General: Bowel sounds are normal. There is no distension.     Palpations: Abdomen is soft. There is no mass.     Tenderness: There is no abdominal tenderness. There is no right CVA tenderness or left CVA tenderness.  Musculoskeletal:        General: Normal range of motion.     Lumbar back: Tenderness present.  Skin:    General: Skin is warm and dry.  Neurological:     General: No focal deficit present.     Mental Status: She is alert and oriented to person, place, and time. Mental status is at baseline.  Psychiatric:        Mood and Affect: Mood normal.        Behavior: Behavior normal.        Thought Content: Thought content normal.        Judgment: Judgment normal.     Results for orders placed or performed in visit on 06/08/24  POC Urinalysis Dipstick  Result Value Ref Range   Color, UA yellow    Clarity, UA light  Glucose, UA Negative Negative   Bilirubin, UA negative    Ketones, UA negative    Spec Grav, UA 1.015 1.010 - 1.025   Blood, UA negative    pH, UA 6.0 5.0 - 8.0   Protein, UA Negative Negative   Urobilinogen, UA 0.2 0.2 or 1.0 E.U./dL   Nitrite, UA negative    Leukocytes, UA Negative Negative   Appearance     Odor          Assessment & Plan:  Acute low back pain, unspecified back pain laterality, unspecified whether sciatica present -     POCT urinalysis dipstick -     Methocarbamol ; Take 1 tablet (500 mg total) by mouth every 8 (eight) hours as needed for up to 5 days for muscle spasms.  Dispense: 15 tablet; Refill: 0  Increased frequency of urination -     Urine Culture  -Urinalysis is negative for signs of a urinary tract infection. Will send urine for a culture to confirm. -Prescribed Methocarbamol  (Robaxin ) 500mg  tablet, take 1 tablet every 8 hours as needed for muscle spasms. Caution: medication can cause drowsiness. -May apply heat or cool compresses to the area of pain, 4-6  times a day, up to 20 minutes at a time.  -May take either Naproxen 500mg  twice a day for 5 days or Ibuprofen  600-800mg  every 8 hours for 5 days. Please choose either medication, but do not take both together. Take with food.  -Follow up if not improved with PCP.   Hjalmar Ballengee, NP

## 2024-06-08 NOTE — Patient Instructions (Signed)
-  It was a pleasure to care for you today.  -Urinalysis is negative for signs of a urinary tract infection. Will send urine for a culture to confirm. -Prescribed Methocarbamol  (Robaxin ) 500mg  tablet, take 1 tablet every 8 hours as needed for muscle spasms. Caution: medication can cause drowsiness. -May apply heat or cool compresses to the area of pain, 4-6 times a day, up to 20 minutes at a time.  -May take either Naproxen 500mg  twice a day for 5 days or Ibuprofen  600-800mg  every 8 hours for 5 days. Please choose either medication, but do not take both together. Take with food.  -Follow up if not improved with PCP.

## 2024-06-09 LAB — URINE CULTURE
MICRO NUMBER:: 16665470
Result:: NO GROWTH
SPECIMEN QUALITY:: ADEQUATE

## 2024-06-10 ENCOUNTER — Ambulatory Visit: Payer: Self-pay | Admitting: Family Medicine

## 2024-08-27 ENCOUNTER — Other Ambulatory Visit: Payer: Self-pay | Admitting: Family Medicine

## 2024-08-27 DIAGNOSIS — Z1231 Encounter for screening mammogram for malignant neoplasm of breast: Secondary | ICD-10-CM

## 2024-09-04 DIAGNOSIS — K5190 Ulcerative colitis, unspecified, without complications: Secondary | ICD-10-CM | POA: Diagnosis not present

## 2024-10-16 ENCOUNTER — Ambulatory Visit
Admission: RE | Admit: 2024-10-16 | Discharge: 2024-10-16 | Disposition: A | Discharge status: Home / Self Care | Source: Ambulatory Visit | Attending: Family Medicine | Admitting: Family Medicine

## 2024-10-16 DIAGNOSIS — Z1231 Encounter for screening mammogram for malignant neoplasm of breast: Secondary | ICD-10-CM

## 2024-11-11 ENCOUNTER — Encounter: Payer: Self-pay | Admitting: Obstetrics and Gynecology

## 2025-02-16 ENCOUNTER — Ambulatory Visit: Admitting: Obstetrics and Gynecology

## 2025-02-19 ENCOUNTER — Encounter: Admitting: Family Medicine
